# Patient Record
Sex: Female | Born: 1975
Health system: Southern US, Community
[De-identification: ages and names within clinical notes are randomized; demographics above are authoritative.]

## PROBLEM LIST (undated history)

## (undated) DIAGNOSIS — K219 Gastro-esophageal reflux disease without esophagitis: Secondary | ICD-10-CM

## (undated) HISTORY — DX: Gastro-esophageal reflux disease without esophagitis: K21.9

---

## 2000-09-30 ENCOUNTER — Other Ambulatory Visit: Admission: RE | Admit: 2000-09-30 | Discharge: 2000-09-30 | Payer: Self-pay | Admitting: Obstetrics and Gynecology

## 2000-10-04 ENCOUNTER — Encounter: Payer: Self-pay | Admitting: *Deleted

## 2000-10-04 ENCOUNTER — Emergency Department (HOSPITAL_COMMUNITY): Admission: EM | Admit: 2000-10-04 | Discharge: 2000-10-04 | Payer: Self-pay | Admitting: *Deleted

## 2001-10-22 ENCOUNTER — Other Ambulatory Visit: Admission: RE | Admit: 2001-10-22 | Discharge: 2001-10-22 | Payer: Self-pay | Admitting: Obstetrics & Gynecology

## 2001-11-18 ENCOUNTER — Ambulatory Visit (HOSPITAL_COMMUNITY): Admission: RE | Admit: 2001-11-18 | Discharge: 2001-11-18 | Payer: Self-pay | Admitting: Obstetrics & Gynecology

## 2001-11-18 ENCOUNTER — Encounter: Payer: Self-pay | Admitting: Obstetrics & Gynecology

## 2003-07-01 ENCOUNTER — Other Ambulatory Visit: Admission: RE | Admit: 2003-07-01 | Discharge: 2003-07-01 | Payer: Self-pay | Admitting: Family Medicine

## 2003-09-23 ENCOUNTER — Ambulatory Visit (HOSPITAL_COMMUNITY): Admission: RE | Admit: 2003-09-23 | Discharge: 2003-09-23 | Payer: Self-pay | Admitting: Family Medicine

## 2003-12-10 ENCOUNTER — Ambulatory Visit (HOSPITAL_COMMUNITY): Admission: RE | Admit: 2003-12-10 | Discharge: 2003-12-10 | Payer: Self-pay | Admitting: Family Medicine

## 2004-05-12 ENCOUNTER — Ambulatory Visit: Payer: Self-pay | Admitting: *Deleted

## 2004-05-12 ENCOUNTER — Inpatient Hospital Stay (HOSPITAL_COMMUNITY): Admission: AD | Admit: 2004-05-12 | Discharge: 2004-05-17 | Payer: Self-pay | Admitting: Family Medicine

## 2004-06-26 ENCOUNTER — Other Ambulatory Visit: Admission: RE | Admit: 2004-06-26 | Discharge: 2004-06-26 | Payer: Self-pay | Admitting: Family Medicine

## 2012-07-03 ENCOUNTER — Telehealth: Payer: Self-pay | Admitting: Nurse Practitioner

## 2012-07-03 MED ORDER — OMEPRAZOLE 20 MG PO CPDR: 20.0000 mg | DELAYED_RELEASE_CAPSULE | Freq: Every day | ORAL | Status: DC

## 2012-07-03 NOTE — Telephone Encounter (Signed)
Refilled at [facility] by [facility]

## 2012-08-08 ENCOUNTER — Ambulatory Visit (INDEPENDENT_AMBULATORY_CARE_PROVIDER_SITE_OTHER): Payer: BC Managed Care – PPO

## 2012-08-08 ENCOUNTER — Ambulatory Visit (INDEPENDENT_AMBULATORY_CARE_PROVIDER_SITE_OTHER): Payer: BC Managed Care – PPO | Admitting: General Practice

## 2012-08-08 ENCOUNTER — Encounter: Payer: Self-pay | Admitting: General Practice

## 2012-08-08 VITALS — BP 111/78 | HR 91 | Temp 97.1°F | Ht 63.0 in | Wt 142.0 lb

## 2012-08-08 DIAGNOSIS — R109 Unspecified abdominal pain: Secondary | ICD-10-CM

## 2012-08-08 LAB — POCT CBC
Granulocyte percent: 90.9 %G — AB (ref 37–80)
HCT, POC: 38 % (ref 37.7–47.9)
Hemoglobin: 13.3 g/dL (ref 12.2–16.2)
Lymph, poc: 0.9 (ref 0.6–3.4)
MCH, POC: 30.4 pg (ref 27–31.2)
MCHC: 35 g/dL (ref 31.8–35.4)
MCV: 86.9 fL (ref 80–97)
MPV: 7.9 fL (ref 0–99.8)
POC Granulocyte: 11.2 — AB (ref 2–6.9)
POC LYMPH PERCENT: 7.4 %L — AB (ref 10–50)
Platelet Count, POC: 252 10*3/uL (ref 142–424)
RBC: 4.4 M/uL (ref 4.04–5.48)
RDW, POC: 13.4 %
WBC: 12.3 10*3/uL — AB (ref 4.6–10.2)

## 2012-08-08 LAB — POCT URINE PREGNANCY: Preg Test, Ur: NEGATIVE

## 2012-08-08 NOTE — Patient Instructions (Addendum)

## 2012-08-08 NOTE — Progress Notes (Signed)
  Subjective:    Patient ID: Kathryn Singh, female    DOB: 1975/05/22, 37 y.o.   MRN: [id number]  Abdominal Pain This is a new problem. The current episode started 1 to 4 weeks ago. The onset quality is gradual. The problem occurs constantly. The problem has been gradually worsening. The pain is located in the epigastric region. The pain is at a severity of 9/10. The quality of the pain is aching. The abdominal pain radiates to the epigastric region and back. Associated symptoms include nausea. Pertinent negatives include no belching, constipation, fever, headaches or hematuria. Nothing aggravates the pain. The pain is relieved by nothing. Treatments tried: ibuprofen. Her past medical history is significant for abdominal surgery. c-section  Reports menstrual cycle began last Aug 01, 2012. Reports over past three months menstrual cramps have worsened. Reports menstrual cycle ended on Tuesday. Reports pain is worse today. Reports taking omeprazole as directed.     Review of Systems  Constitutional: Negative for fever and chills.  Respiratory: Negative for chest tightness and shortness of breath.   Cardiovascular: Negative for chest pain and palpitations.  Gastrointestinal: Positive for nausea and abdominal pain. Negative for constipation, blood in stool and anal bleeding.  Genitourinary: Negative for hematuria and difficulty urinating.  Musculoskeletal: Positive for back pain.  Skin: Negative.   Neurological: Negative for dizziness and headaches.  Psychiatric/Behavioral: Negative.        Objective:   Physical Exam  Constitutional: She is oriented to person, place, and time. She appears well-developed and well-nourished.  Cardiovascular: Normal rate, regular rhythm and normal heart sounds.   Pulmonary/Chest: Effort normal and breath sounds normal. No respiratory distress. She exhibits no tenderness.  Abdominal: Soft. There is no CVA tenderness, no tenderness at McBurney's point and negative  Murphy's sign.  Tenderness with light palpation  Neurological: She is alert and oriented to person, place, and time.  Skin: Skin is warm and dry.  Psychiatric: She has a normal mood and affect.   Results for orders placed in visit on 08/08/12  POCT CBC      Result Value Range   WBC 12.3 (*) 4.6 - 10.2 K/uL   Lymph, poc 0.9  0.6 - 3.4   POC LYMPH PERCENT 7.4 (*) 10 - 50 %L   POC Granulocyte 11.2 (*) 2 - 6.9   Granulocyte percent 90.9 (*) 37 - 80 %G   RBC 4.4  4.04 - 5.48 M/uL   Hemoglobin 13.3  12.2 - 16.2 g/dL   HCT, POC 40.9  81.1 - 47.9 %   MCV 86.9  80 - 97 fL   MCH, POC 30.4  27 - 31.2 pg   MCHC 35.0  31.8 - 35.4 g/dL   RDW, POC 91.4     Platelet Count, POC 252.0  142 - 424 K/uL   MPV 7.9  0 - 99.8 fL  POCT URINE PREGNANCY      Result Value Range   Preg Test, Ur Negative    [facility] reading by [facility], FNP-C, no large amount of air or stool noted.                                      Assessment & Plan:  1. Abdominal pain, unspecified site - POCT CBC - KUB -Advised patient to go to local emergency room (pt's husband driving her to [facility])  Coralie Keens, FNP-C

## 2012-08-18 ENCOUNTER — Encounter: Payer: Self-pay | Admitting: *Deleted

## 2012-08-18 NOTE — Progress Notes (Signed)
Letter mailed

## 2012-09-04 ENCOUNTER — Other Ambulatory Visit: Payer: Self-pay | Admitting: General Practice

## 2013-03-02 ENCOUNTER — Other Ambulatory Visit: Payer: Self-pay

## 2013-03-02 NOTE — Telephone Encounter (Signed)
Last seen 08/08/12  mae

## 2013-03-04 ENCOUNTER — Other Ambulatory Visit: Payer: Self-pay

## 2013-03-04 NOTE — Telephone Encounter (Signed)
latr seen 08/08/12  Mae

## 2013-03-06 ENCOUNTER — Other Ambulatory Visit: Payer: Self-pay | Admitting: General Practice

## 2013-03-06 MED ORDER — OMEPRAZOLE 20 MG PO CPDR: 20.0000 mg | DELAYED_RELEASE_CAPSULE | Freq: Every day | ORAL | Status: DC

## 2013-10-01 ENCOUNTER — Other Ambulatory Visit: Payer: Self-pay | Admitting: Nurse Practitioner

## 2013-10-16 ENCOUNTER — Other Ambulatory Visit: Payer: Self-pay | Admitting: Nurse Practitioner

## 2013-11-03 ENCOUNTER — Ambulatory Visit (INDEPENDENT_AMBULATORY_CARE_PROVIDER_SITE_OTHER): Payer: BC Managed Care – PPO | Admitting: Family

## 2013-11-03 ENCOUNTER — Encounter: Payer: Self-pay | Admitting: Family

## 2013-11-03 VITALS — BP 122/80 | HR 69 | Temp 98.1°F | Ht 63.0 in | Wt 141.8 lb

## 2013-11-03 DIAGNOSIS — J302 Other seasonal allergic rhinitis: Secondary | ICD-10-CM

## 2013-11-03 DIAGNOSIS — Z0000 Encounter for general adult medical examination without abnormal findings: Secondary | ICD-10-CM

## 2013-11-03 DIAGNOSIS — K219 Gastro-esophageal reflux disease without esophagitis: Secondary | ICD-10-CM

## 2013-11-03 DIAGNOSIS — J309 Allergic rhinitis, unspecified: Secondary | ICD-10-CM

## 2013-11-03 MED ORDER — OMEPRAZOLE 20 MG PO CPDR: 20.0000 mg | DELAYED_RELEASE_CAPSULE | Freq: Every day | ORAL | Status: DC

## 2013-11-03 MED ORDER — MONTELUKAST SODIUM 10 MG PO TABS: 10.0000 mg | ORAL_TABLET | Freq: Every day | ORAL | Status: DC

## 2013-11-03 NOTE — Patient Instructions (Signed)

## 2013-11-03 NOTE — Progress Notes (Signed)
   Subjective:    Patient ID: Kathryn Singh, female    DOB: 30-Jul-1975, 38 y.o.   MRN: [id number]  Pt presents to the office for annual physical. Gastrophageal Reflux She reports no chest pain, no coughing, no heartburn, no sore throat or no wheezing. This is a chronic problem. The current episode started more than 1 year ago. The problem occurs rarely. The problem has been resolved. The symptoms are aggravated by certain foods. Pertinent negatives include no fatigue. She has tried a PPI for the symptoms. The treatment provided significant relief.      Review of Systems  Constitutional: Negative.  Negative for fatigue.  HENT: Positive for sneezing. Negative for sore throat.   Eyes: Negative.   Respiratory: Negative.  Negative for cough, shortness of breath and wheezing.   Cardiovascular: Negative.  Negative for chest pain and palpitations.  Gastrointestinal: Negative.  Negative for heartburn.  Endocrine: Negative.   Genitourinary: Negative.   Musculoskeletal: Negative.   Neurological: Negative.  Negative for headaches.  Hematological: Negative.   Psychiatric/Behavioral: Negative.   All other systems reviewed and are negative.      Objective:   Physical Exam  Vitals reviewed. Constitutional: She is oriented to person, place, and time. She appears well-developed and well-nourished. No distress.  HENT:  Head: Normocephalic and atraumatic.  Right Ear: External ear normal.  Left Ear: External ear normal.  Nose: Nose normal.  Mouth/Throat: Oropharynx is clear and moist.  Eyes: Pupils are equal, round, and reactive to light.  Neck: Normal range of motion. Neck supple. No thyromegaly present.  Cardiovascular: Normal rate, regular rhythm, normal heart sounds and intact distal pulses.   No murmur heard. Pulmonary/Chest: Effort normal and breath sounds normal. No respiratory distress. She has no wheezes.  Abdominal: Soft. Bowel sounds are normal. She exhibits no distension. There is no  tenderness.  Musculoskeletal: Normal range of motion. She exhibits no edema and no tenderness.  Neurological: She is alert and oriented to person, place, and time. She has normal reflexes. No cranial nerve deficit.  Skin: Skin is warm and dry.  Psychiatric: She has a normal mood and affect. Her behavior is normal. Judgment and thought content normal.    BP 122/80  Pulse 69  Temp(Src) 98.1 F (36.7 C) (Oral)  Ht _0  (1.6 m)  Wt 141 lb 12.8 oz (64.32 kg)  BMI 25.13 kg/m2       Assessment & Plan:  1. Gastroesophageal reflux disease without esophagitis - omeprazole (PRILOSEC) 20 MG capsule; Take 1 capsule (20 mg total) by mouth daily.  Dispense: 90 capsule; Refill: 4 - CMP14+EGFR  2. Seasonal allergies - CMP14+EGFR - montelukast (SINGULAIR) 10 MG tablet; Take 1 tablet (10 mg total) by mouth at bedtime.  Dispense: 90 tablet; Refill: 4  3. Annual physical exam - CMP14+EGFR - Lipid panel - Vit D  25 hydroxy (rtn osteoporosis monitoring)   Continue all meds Labs pending Health Maintenance reviewed Diet and exercise encouraged RTO 1 year  Evelina Dun, FNP

## 2013-11-04 ENCOUNTER — Other Ambulatory Visit: Payer: Self-pay | Admitting: Family

## 2013-11-04 LAB — LIPID PANEL
CHOL/HDL RATIO: 4.5 ratio — AB (ref 0.0–4.4)
Cholesterol, Total: 195 mg/dL (ref 100–199)
HDL: 43 mg/dL (ref >39)
LDL Calculated: 119 mg/dL — ABNORMAL HIGH (ref 0–99)
TRIGLYCERIDES: 166 mg/dL — AB (ref 0–149)
VLDL CHOLESTEROL CAL: 33 mg/dL (ref 5–40)

## 2013-11-04 LAB — CMP14+EGFR
ALK PHOS: 76 IU/L (ref 39–117)
ALT: 27 IU/L (ref 0–32)
AST: 21 IU/L (ref 0–40)
Albumin/Globulin Ratio: 1.4 (ref 1.1–2.5)
Albumin: 4.3 g/dL (ref 3.5–5.5)
BUN / CREAT RATIO: 19 (ref 8–20)
BUN: 13 mg/dL (ref 6–20)
CALCIUM: 9.4 mg/dL (ref 8.7–10.2)
CO2: 22 mmol/L (ref 18–29)
Chloride: 101 mmol/L (ref 97–108)
Creatinine, Ser: 0.68 mg/dL (ref 0.57–1.00)
GFR calc Af Amer: 128 mL/min/{1.73_m2} (ref >59)
GFR calc non Af Amer: 111 mL/min/{1.73_m2} (ref >59)
GLOBULIN, TOTAL: 3 g/dL (ref 1.5–4.5)
Glucose: 88 mg/dL (ref 65–99)
Potassium: 4.2 mmol/L (ref 3.5–5.2)
Sodium: 138 mmol/L (ref 134–144)
Total Bilirubin: 0.5 mg/dL (ref 0.0–1.2)
Total Protein: 7.3 g/dL (ref 6.0–8.5)

## 2013-11-04 LAB — VITAMIN D 25 HYDROXY (VIT D DEFICIENCY, FRACTURES): Vit D, 25-Hydroxy: 25.2 ng/mL — ABNORMAL LOW (ref 30.0–100.0)

## 2013-11-04 MED ORDER — SIMVASTATIN 20 MG PO TABS: 20.0000 mg | ORAL_TABLET | Freq: Every day | ORAL | Status: DC

## 2013-11-04 MED ORDER — VITAMIN D (ERGOCALCIFEROL) 1.25 MG (50000 UNIT) PO CAPS: 50000.0000 [IU] | ORAL_CAPSULE | ORAL | Status: DC

## 2013-11-05 ENCOUNTER — Encounter: Payer: Self-pay | Admitting: Family Medicine

## 2013-11-30 ENCOUNTER — Ambulatory Visit (INDEPENDENT_AMBULATORY_CARE_PROVIDER_SITE_OTHER): Payer: BC Managed Care – PPO | Admitting: Family Medicine

## 2013-11-30 VITALS — BP 117/75 | HR 115 | Temp 100.5°F | Ht 63.0 in | Wt 144.0 lb

## 2013-11-30 DIAGNOSIS — J029 Acute pharyngitis, unspecified: Secondary | ICD-10-CM

## 2013-11-30 DIAGNOSIS — D72829 Elevated white blood cell count, unspecified: Secondary | ICD-10-CM

## 2013-11-30 DIAGNOSIS — R197 Diarrhea, unspecified: Secondary | ICD-10-CM

## 2013-11-30 DIAGNOSIS — R52 Pain, unspecified: Secondary | ICD-10-CM

## 2013-11-30 DIAGNOSIS — R109 Unspecified abdominal pain: Secondary | ICD-10-CM

## 2013-11-30 LAB — POCT URINALYSIS DIPSTICK
Bilirubin, UA: NEGATIVE
Glucose, UA: NEGATIVE
Ketones, UA: NEGATIVE
Leukocytes, UA: NEGATIVE
Nitrite, UA: NEGATIVE
Spec Grav, UA: 1.01
Urobilinogen, UA: NEGATIVE
pH, UA: 8

## 2013-11-30 LAB — POCT UA - MICROSCOPIC ONLY
Casts, Ur, LPF, POC: NEGATIVE
Crystals, Ur, HPF, POC: NEGATIVE
Yeast, UA: NEGATIVE

## 2013-11-30 LAB — POCT CBC
Granulocyte percent: 90.1 %G — AB (ref 37–80)
HCT, POC: 36.6 % — AB (ref 37.7–47.9)
Hemoglobin: 11.8 g/dL — AB (ref 12.2–16.2)
Lymph, poc: 0.9 (ref 0.6–3.4)
MCH, POC: 27 pg (ref 27–31.2)
MCHC: 32.2 g/dL (ref 31.8–35.4)
MCV: 83.6 fL (ref 80–97)
MPV: 7.7 fL (ref 0–99.8)
POC Granulocyte: 13.3 — AB (ref 2–6.9)
POC LYMPH PERCENT: 5.9 %L — AB (ref 10–50)
Platelet Count, POC: 273 10*3/uL (ref 142–424)
RBC: 4.4 M/uL (ref 4.04–5.48)
RDW, POC: 14.4 %
WBC: 14.8 10*3/uL — AB (ref 4.6–10.2)

## 2013-11-30 LAB — POCT RAPID STREP A (OFFICE): Rapid Strep A Screen: NEGATIVE

## 2013-11-30 LAB — POCT INFLUENZA A/B
Influenza A, POC: NEGATIVE
Influenza B, POC: NEGATIVE

## 2013-11-30 MED ORDER — CEFTRIAXONE SODIUM 1 G IJ SOLR
1.0000 g | INTRAMUSCULAR | Status: AC
  Administered 2013-11-30: 1 g via INTRAMUSCULAR

## 2013-11-30 MED ORDER — HYDROCODONE-ACETAMINOPHEN 5-325 MG PO TABS: 1.0000 | ORAL_TABLET | Freq: Four times a day (QID) | ORAL | Status: DC | PRN

## 2013-11-30 MED ORDER — AZITHROMYCIN 250 MG PO TABS: ORAL_TABLET | ORAL | Status: DC

## 2013-11-30 NOTE — Progress Notes (Signed)
Subjective:    Patient ID: Kathryn Singh, female    DOB: [birth year], 39 y.o.   MRN: [id number]  HPI This 38 y.o. female presents for evaluation of c/o sore throat, diarrhea,back pain and malaise. She has been feeling like this for 2 days.  She has been having low grade fever.  She c/o nausea and  Lower abdominal discomfort.  She c/o severe sore throat and back pain.   She denies any vaginal DC Or productive cough.  She denies urinary sx's.   Review of Systems C/o sore throat, abdominal pain, myalgias and arthralgias, fever, malaise No chest pain, SOB, HA, dizziness, vision change, N/V, diarrhea, constipation, dysuria, urinary urgency or frequency or rash.     Objective:   Physical Exam  Vital signs noted  Acutely ill appearing female  HEENT - Head atraumatic Normocephalic                Eyes - PERRLA, Conjuctiva - clear Sclera- Clear EOMI                Ears - EAC's Wnl TM's Wnl Gross Hearing WNL                Throat - oropharanx injected                Neck - supple no bruits, chin to chest w/o neck pain. Respiratory - Lungs CTA bilateral Cardiac - RRR S1 and S2 without murmur GI - Abdomen soft tender in lower abdomen bilateral and bowel sounds active x 4 Extremities - No edema. Neuro - Grossly intact.  Results for orders placed in visit on 11/30/13  POCT INFLUENZA A/B      Result Value Ref Range   Influenza A, POC Negative     Influenza B, POC Negative    POCT RAPID STREP A (OFFICE)      Result Value Ref Range   Rapid Strep A Screen Negative  Negative  POCT CBC      Result Value Ref Range   WBC 14.8 (*) 4.6 - 10.2 K/uL   Lymph, poc 0.9  0.6 - 3.4   POC LYMPH PERCENT 5.9 (*) 10 - 50 %L   MID (cbc)    0 - 0.9   POC MID %    0 - 12 %M   POC Granulocyte 13.3 (*) 2 - 6.9   Granulocyte percent 90.1 (*) 37 - 80 %G   RBC 4.4  4.04 - 5.48 M/uL   Hemoglobin 11.8 (*) 12.2 - 16.2 g/dL   HCT, POC 40.9 (*) 81.1 - 47.9 %   MCV 83.6  80 - 97 fL   MCH, POC 27.0  27 - 31.2 pg    MCHC 32.2  31.8 - 35.4 g/dL   RDW, POC 91.4     Platelet Count, POC 273.0  142 - 424 K/uL   MPV 7.7  0 - 99.8 fL  POCT UA - MICROSCOPIC ONLY      Result Value Ref Range   WBC, Ur, HPF, POC 5-10     RBC, urine, microscopic 1-5     Bacteria, U Microscopic FEW     Mucus, UA MOD     Epithelial cells, urine per micros FEW     Crystals, Ur, HPF, POC NEG     Casts, Ur, LPF, POC NEG     Yeast, UA NEG    POCT URINALYSIS DIPSTICK      Result Value Ref Range  Color, UA GOLD     Clarity, UA CLEAR     Glucose, UA NEG     Bilirubin, UA NEG     Ketones, UA NEG     Spec Grav, UA 1.010     Blood, UA TRACE     pH, UA 8.0     Protein, UA TRACE     Urobilinogen, UA negative     Nitrite, UA NEG     Leukocytes, UA Negative        Assessment & Plan:  Diarrhea - Plan: POCT Influenza A/B, POCT rapid strep A  Sore throat - Plan: POCT Influenza A/B, POCT rapid strep A, cefTRIAXone (ROCEPHIN) injection 1 g, azithromycin (ZITHROMAX) 250 MG tablet  Body aches - Plan: POCT Influenza A/B, POCT rapid strep A, POCT CBC, POCT UA - Microscopic Only, POCT urinalysis dipstick, HYDROcodone-acetaminophen (NORCO) 5-325 MG per tablet  Abdominal pain, unspecified site - Plan: POCT CBC, POCT UA - Microscopic Only, POCT urinalysis dipstick  Leukocytosis, unspecified - Plan: cefTRIAXone (ROCEPHIN) injection 1 g, azithromycin (ZITHROMAX) 250 MG tablet.  Could be due to pharyngitis.  Follow up if not feeling better in next few days.  [name] FNP

## 2013-12-01 LAB — URINE CULTURE: Organism ID, Bacteria: NO GROWTH

## 2013-12-02 ENCOUNTER — Telehealth: Payer: Self-pay | Admitting: Family Medicine

## 2013-12-02 ENCOUNTER — Other Ambulatory Visit: Payer: Self-pay | Admitting: Family Medicine

## 2013-12-02 MED ORDER — METHYLPREDNISOLONE (PAK) 4 MG PO TABS: ORAL_TABLET | ORAL | Status: DC

## 2013-12-02 MED ORDER — LEVOFLOXACIN 500 MG PO TABS: 500.0000 mg | ORAL_TABLET | Freq: Every day | ORAL | Status: DC

## 2013-12-02 NOTE — Telephone Encounter (Signed)
Spoke with patient.

## 2013-12-02 NOTE — Telephone Encounter (Signed)
Kathryn Stable do you know anything about this one?

## 2013-12-02 NOTE — Telephone Encounter (Signed)
meds sent to pharmacy

## 2013-12-24 ENCOUNTER — Other Ambulatory Visit: Payer: BC Managed Care – PPO | Admitting: Nurse Practitioner

## 2013-12-29 ENCOUNTER — Ambulatory Visit (INDEPENDENT_AMBULATORY_CARE_PROVIDER_SITE_OTHER): Payer: BC Managed Care – PPO

## 2013-12-29 DIAGNOSIS — Z23 Encounter for immunization: Secondary | ICD-10-CM

## 2014-01-01 ENCOUNTER — Other Ambulatory Visit: Payer: BC Managed Care – PPO | Admitting: Nurse Practitioner

## 2014-03-06 ENCOUNTER — Ambulatory Visit (INDEPENDENT_AMBULATORY_CARE_PROVIDER_SITE_OTHER): Payer: 59 | Admitting: Family Medicine

## 2014-03-06 VITALS — BP 99/64 | HR 77 | Temp 97.9°F | Ht 63.0 in | Wt 143.0 lb

## 2014-03-06 DIAGNOSIS — R300 Dysuria: Secondary | ICD-10-CM

## 2014-03-06 DIAGNOSIS — R319 Hematuria, unspecified: Secondary | ICD-10-CM

## 2014-03-06 DIAGNOSIS — N390 Urinary tract infection, site not specified: Secondary | ICD-10-CM

## 2014-03-06 LAB — POCT UA - MICROSCOPIC ONLY
Bacteria, U Microscopic: NEGATIVE
Casts, Ur, LPF, POC: NEGATIVE
Crystals, Ur, HPF, POC: NEGATIVE
Mucus, UA: NEGATIVE
RBC, urine, microscopic: NEGATIVE
Yeast, UA: NEGATIVE

## 2014-03-06 LAB — POCT URINALYSIS DIPSTICK
Bilirubin, UA: NEGATIVE
Blood, UA: NEGATIVE
Glucose, UA: NEGATIVE
Ketones, UA: NEGATIVE
Nitrite, UA: NEGATIVE
Protein, UA: NEGATIVE
Spec Grav, UA: 1.015
Urobilinogen, UA: NEGATIVE
pH, UA: 6.5

## 2014-03-06 MED ORDER — PHENAZOPYRIDINE HCL 200 MG PO TABS: 200.0000 mg | ORAL_TABLET | Freq: Three times a day (TID) | ORAL | Status: DC | PRN

## 2014-03-06 MED ORDER — CIPROFLOXACIN HCL 500 MG PO TABS: 500.0000 mg | ORAL_TABLET | Freq: Two times a day (BID) | ORAL | Status: DC

## 2014-03-06 NOTE — Progress Notes (Signed)
   Subjective:    Patient ID: Kathryn ForesterJuana M Hancock, female    DOB: 05/28/1975, 38 y.o.   MRN: [id number]  HPI Patient c/o urinary sx's  Review of Systems  Constitutional: Negative for fever.  HENT: Negative for ear pain.   Eyes: Negative for discharge.  Respiratory: Negative for cough.   Cardiovascular: Negative for chest pain.  Gastrointestinal: Negative for abdominal distention.  Endocrine: Negative for polyuria.  Genitourinary: Negative for difficulty urinating.  Musculoskeletal: Negative for gait problem and neck pain.  Skin: Negative for color change and rash.  Neurological: Negative for speech difficulty and headaches.  Psychiatric/Behavioral: Negative for agitation.       Objective:    BP 99/64 mmHg  Pulse 77  Temp(Src) 97.9 F (36.6 C) (Oral)  Ht 5\' 3"  (1.6 m)  Wt 143 lb (64.864 kg)  BMI 25.34 kg/m2 Physical Exam  Constitutional: She is oriented to person, place, and time. She appears well-developed and well-nourished.  HENT:  Head: Normocephalic and atraumatic.  Mouth/Throat: Oropharynx is clear and moist.  Eyes: Pupils are equal, round, and reactive to light.  Neck: Normal range of motion. Neck supple.  Cardiovascular: Normal rate and regular rhythm.   No murmur heard. Pulmonary/Chest: Effort normal and breath sounds normal.  Abdominal: Soft. Bowel sounds are normal. There is no tenderness.  Neurological: She is alert and oriented to person, place, and time.  Skin: Skin is warm and dry.  Psychiatric: She has a normal mood and affect.    Results for orders placed or performed in visit on 03/06/14  POCT UA - Microscopic Only  Result Value Ref Range   WBC, Ur, HPF, POC 10-15    RBC, urine, microscopic neg    Bacteria, U Microscopic neg    Mucus, UA neg    Epithelial cells, urine per micros occ    Crystals, Ur, HPF, POC neg    Casts, Ur, LPF, POC neg    Yeast, UA neg   POCT urinalysis dipstick  Result Value Ref Range   Color, UA yellow    Clarity, UA cloudy     Glucose, UA neg    Bilirubin, UA neg    Ketones, UA neg    Spec Grav, UA 1.015    Blood, UA neg    pH, UA 6.5    Protein, UA neg    Urobilinogen, UA negative    Nitrite, UA neg    Leukocytes, UA moderate (2+)         Assessment & Plan:     ICD-9-CM ICD-10-CM   1. Dysuria 788.1 R30.0 POCT UA - Microscopic Only     POCT urinalysis dipstick     ciprofloxacin (CIPRO) 500 MG tablet     phenazopyridine (PYRIDIUM) 200 MG tablet     Urine culture  2. Urinary tract infection with hematuria, site unspecified 599.0 N39.0 ciprofloxacin (CIPRO) 500 MG tablet    R31.9 phenazopyridine (PYRIDIUM) 200 MG tablet     Urine culture     No Follow-up on file.  Deatra CanterWilliam J Camreigh Michie FNP

## 2014-03-08 LAB — URINE CULTURE

## 2014-04-07 ENCOUNTER — Ambulatory Visit: Payer: 59 | Admitting: Family Medicine

## 2014-06-01 ENCOUNTER — Ambulatory Visit (INDEPENDENT_AMBULATORY_CARE_PROVIDER_SITE_OTHER): Payer: 59 | Admitting: Family

## 2014-06-01 ENCOUNTER — Encounter: Payer: Self-pay | Admitting: Family

## 2014-06-01 VITALS — BP 101/71 | HR 90 | Temp 97.9°F | Ht 63.0 in | Wt 142.6 lb

## 2014-06-01 DIAGNOSIS — K219 Gastro-esophageal reflux disease without esophagitis: Secondary | ICD-10-CM

## 2014-06-01 DIAGNOSIS — F32A Depression, unspecified: Secondary | ICD-10-CM

## 2014-06-01 DIAGNOSIS — J309 Allergic rhinitis, unspecified: Secondary | ICD-10-CM | POA: Insufficient documentation

## 2014-06-01 DIAGNOSIS — Z01419 Encounter for gynecological examination (general) (routine) without abnormal findings: Secondary | ICD-10-CM

## 2014-06-01 DIAGNOSIS — F329 Major depressive disorder, single episode, unspecified: Secondary | ICD-10-CM

## 2014-06-01 DIAGNOSIS — Z0000 Encounter for general adult medical examination without abnormal findings: Secondary | ICD-10-CM

## 2014-06-01 DIAGNOSIS — E785 Hyperlipidemia, unspecified: Secondary | ICD-10-CM

## 2014-06-01 DIAGNOSIS — J302 Other seasonal allergic rhinitis: Secondary | ICD-10-CM

## 2014-06-01 MED ORDER — MONTELUKAST SODIUM 10 MG PO TABS: 10.0000 mg | ORAL_TABLET | Freq: Every day | ORAL | Status: DC

## 2014-06-01 MED ORDER — SIMVASTATIN 20 MG PO TABS: 20.0000 mg | ORAL_TABLET | Freq: Every day | ORAL | Status: DC

## 2014-06-01 MED ORDER — ESCITALOPRAM OXALATE 5 MG PO TABS: 5.0000 mg | ORAL_TABLET | Freq: Every day | ORAL | Status: DC

## 2014-06-01 MED ORDER — OMEPRAZOLE 20 MG PO CPDR: 20.0000 mg | DELAYED_RELEASE_CAPSULE | Freq: Every day | ORAL | Status: DC

## 2014-06-01 NOTE — Progress Notes (Signed)
Subjective:    Patient ID: ENGLISH TOMER, female    DOB: 1975/04/14, 39 y.o.   MRN: [id number]  Pt presents to the office today for CPE with pap. Pt has the following chronic conditions.  Gynecologic Exam Pertinent negatives include no headaches, nausea or sore throat.  Hyperlipidemia This is a chronic problem. The current episode started more than 1 year ago. The problem is uncontrolled. Recent lipid tests were reviewed and are high. She has no history of diabetes or hypothyroidism. Pertinent negatives include no chest pain, leg pain, myalgias or shortness of breath. Current antihyperlipidemic treatment includes statins. The current treatment provides moderate improvement of lipids. Risk factors for coronary artery disease include dyslipidemia, family history and a sedentary lifestyle.  Gastrophageal Reflux She reports no chest pain, no coughing, no heartburn, no nausea, no sore throat or no wheezing. This is a chronic problem. The current episode started more than 1 year ago. The problem occurs rarely. The problem has been resolved. The symptoms are aggravated by certain foods. She has tried a PPI for the symptoms. The treatment provided significant relief.      Review of Systems  Constitutional: Negative.   HENT: Negative.  Negative for sore throat.   Eyes: Negative.   Respiratory: Negative.  Negative for cough, shortness of breath and wheezing.   Cardiovascular: Negative.  Negative for chest pain and palpitations.  Gastrointestinal: Negative.  Negative for heartburn and nausea.  Endocrine: Positive for heat intolerance.  Genitourinary: Negative.   Musculoskeletal: Negative.  Negative for myalgias.  Neurological: Negative.  Negative for headaches.  Hematological: Negative.   Psychiatric/Behavioral: Positive for decreased concentration.  All other systems reviewed and are negative.      Objective:   Physical Exam  Constitutional: She is oriented to person, place, and time. She  appears well-developed and well-nourished. No distress.  HENT:  Head: Normocephalic and atraumatic.  Right Ear: External ear normal.  Left Ear: External ear normal.  Nose: Nose normal.  Mouth/Throat: Oropharynx is clear and moist.  Eyes: Pupils are equal, round, and reactive to light.  Neck: Normal range of motion. Neck supple. No thyromegaly present.  Cardiovascular: Normal rate, regular rhythm, normal heart sounds and intact distal pulses.   No murmur heard. Pulmonary/Chest: Effort normal and breath sounds normal. No respiratory distress. She has no wheezes. Right breast exhibits no inverted nipple, no mass, no nipple discharge, no skin change and no tenderness. Left breast exhibits no inverted nipple, no mass, no nipple discharge, no skin change and no tenderness. Breasts are symmetrical.  Abdominal: Soft. Bowel sounds are normal. She exhibits no distension. There is no tenderness.  Genitourinary:  Bimanual exam- no adnexal masses or tenderness, ovaries nonpalpable   Cervix parous and pink- No discharge   Musculoskeletal: Normal range of motion. She exhibits no edema or tenderness.  Neurological: She is alert and oriented to person, place, and time. She has normal reflexes. No cranial nerve deficit.  Skin: Skin is warm and dry.  Psychiatric: She has a normal mood and affect. Her behavior is normal. Judgment and thought content normal.  Vitals reviewed.   BP 101/71 mmHg  Pulse 90  Temp(Src) 97.9 F (36.6 C) (Oral)  Ht '5\' 3"'  (1.6 m)  Wt 142 lb 9.6 oz (64.683 kg)  BMI 25.27 kg/m2  LMP 05/11/2014       Assessment & Plan:  1. Hyperlipidemia - simvastatin (ZOCOR) 20 MG tablet; Take 1 tablet (20 mg total) by mouth at bedtime.  Dispense: 90  tablet; Refill: 4  2. Allergic rhinitis, unspecified allergic rhinitis type - montelukast (SINGULAIR) 10 MG tablet; Take 1 tablet (10 mg total) by mouth at bedtime.  Dispense: 90 tablet; Refill: 4  3. Gastroesophageal reflux disease without  esophagitis - omeprazole (PRILOSEC) 20 MG capsule; Take 1 capsule (20 mg total) by mouth daily.  Dispense: 90 capsule; Refill: 4  4. Seasonal allergies  5. Annual physical exam - CMP14+EGFR; Future - Lipid panel; Future - Vit D  25 hydroxy (rtn osteoporosis monitoring); Future - Thyroid Panel With TSH; Future - Pap IG w/ reflex to HPV when ASC-U  6. Encounter for routine gynecological examination - Pap IG w/ reflex to HPV when ASC-U   Continue all meds Labs pending Health Maintenance reviewed Diet and exercise encouraged RTO 1 year  Evelina Dun, FNP

## 2014-06-01 NOTE — Patient Instructions (Signed)

## 2014-06-02 ENCOUNTER — Telehealth: Payer: Self-pay | Admitting: Family

## 2014-06-02 NOTE — Telephone Encounter (Signed)
Detailed message left that letter has been faxed.  

## 2014-06-03 ENCOUNTER — Other Ambulatory Visit (INDEPENDENT_AMBULATORY_CARE_PROVIDER_SITE_OTHER): Payer: 59

## 2014-06-03 DIAGNOSIS — Z0000 Encounter for general adult medical examination without abnormal findings: Secondary | ICD-10-CM

## 2014-06-03 NOTE — Progress Notes (Signed)
Lab only 

## 2014-06-04 LAB — CMP14+EGFR
ALT: 20 IU/L (ref 0–32)
AST: 14 IU/L (ref 0–40)
Albumin/Globulin Ratio: 1.4 (ref 1.1–2.5)
Albumin: 4.2 g/dL (ref 3.5–5.5)
Alkaline Phosphatase: 73 IU/L (ref 39–117)
BUN/Creatinine Ratio: 19 (ref 8–20)
BUN: 12 mg/dL (ref 6–20)
Bilirubin Total: 0.5 mg/dL (ref 0.0–1.2)
CO2: 21 mmol/L (ref 18–29)
Calcium: 9.1 mg/dL (ref 8.7–10.2)
Chloride: 104 mmol/L (ref 97–108)
Creatinine, Ser: 0.64 mg/dL (ref 0.57–1.00)
GFR calc non Af Amer: 113 mL/min/{1.73_m2} (ref >59)
GFR, EST AFRICAN AMERICAN: 130 mL/min/{1.73_m2} (ref >59)
GLUCOSE: 89 mg/dL (ref 65–99)
Globulin, Total: 2.9 g/dL (ref 1.5–4.5)
Potassium: 4.3 mmol/L (ref 3.5–5.2)
Sodium: 139 mmol/L (ref 134–144)
TOTAL PROTEIN: 7.1 g/dL (ref 6.0–8.5)

## 2014-06-04 LAB — LIPID PANEL
CHOL/HDL RATIO: 4.7 ratio — AB (ref 0.0–4.4)
CHOLESTEROL TOTAL: 191 mg/dL (ref 100–199)
HDL: 41 mg/dL (ref >39)
LDL CALC: 124 mg/dL — AB (ref 0–99)
Triglycerides: 129 mg/dL (ref 0–149)
VLDL CHOLESTEROL CAL: 26 mg/dL (ref 5–40)

## 2014-06-04 LAB — PAP IG W/ RFLX HPV ASCU: PAP Smear Comment: 0

## 2014-06-04 LAB — THYROID PANEL WITH TSH
Free Thyroxine Index: 2.2 (ref 1.2–4.9)
T3 UPTAKE RATIO: 25 % (ref 24–39)
T4 TOTAL: 8.9 ug/dL (ref 4.5–12.0)
TSH: 1.44 u[IU]/mL (ref 0.450–4.500)

## 2014-06-04 LAB — VITAMIN D 25 HYDROXY (VIT D DEFICIENCY, FRACTURES): Vit D, 25-Hydroxy: 12.7 ng/mL — ABNORMAL LOW (ref 30.0–100.0)

## 2014-06-07 ENCOUNTER — Telehealth: Payer: Self-pay | Admitting: Family

## 2014-06-07 ENCOUNTER — Other Ambulatory Visit: Payer: Self-pay | Admitting: Family

## 2014-06-07 DIAGNOSIS — E785 Hyperlipidemia, unspecified: Secondary | ICD-10-CM

## 2014-06-07 DIAGNOSIS — E559 Vitamin D deficiency, unspecified: Secondary | ICD-10-CM | POA: Insufficient documentation

## 2014-06-07 MED ORDER — VITAMIN D (ERGOCALCIFEROL) 1.25 MG (50000 UNIT) PO CAPS: 50000.0000 [IU] | ORAL_CAPSULE | ORAL | Status: DC

## 2014-06-07 MED ORDER — SIMVASTATIN 40 MG PO TABS: 40.0000 mg | ORAL_TABLET | Freq: Every day | ORAL | Status: DC

## 2014-06-10 ENCOUNTER — Telehealth: Payer: Self-pay | Admitting: Family

## 2014-06-10 NOTE — Telephone Encounter (Signed)
She does not feel like escitalopram 5mg  is not helping and wants to know if you will increase the mg?

## 2014-06-13 MED ORDER — ESCITALOPRAM OXALATE 10 MG PO TABS
10.0000 mg | ORAL_TABLET | Freq: Every day | ORAL | Status: DC
Start: 2014-06-13 — End: 2014-09-21

## 2014-06-13 NOTE — Telephone Encounter (Signed)
Dose of lexapro incrased to 10 mg daily

## 2014-06-14 ENCOUNTER — Telehealth: Payer: Self-pay | Admitting: Family Medicine

## 2014-06-14 NOTE — Telephone Encounter (Signed)
appt scheduled

## 2014-06-15 ENCOUNTER — Ambulatory Visit (INDEPENDENT_AMBULATORY_CARE_PROVIDER_SITE_OTHER): Payer: 59 | Admitting: Family

## 2014-06-15 ENCOUNTER — Encounter: Payer: Self-pay | Admitting: Family

## 2014-06-15 VITALS — BP 112/78 | HR 77 | Temp 98.1°F | Ht 63.0 in | Wt 135.0 lb

## 2014-06-15 DIAGNOSIS — J069 Acute upper respiratory infection, unspecified: Secondary | ICD-10-CM

## 2014-06-15 DIAGNOSIS — J309 Allergic rhinitis, unspecified: Secondary | ICD-10-CM | POA: Diagnosis not present

## 2014-06-15 DIAGNOSIS — J029 Acute pharyngitis, unspecified: Secondary | ICD-10-CM

## 2014-06-15 MED ORDER — AZITHROMYCIN 250 MG PO TABS: ORAL_TABLET | ORAL | Status: DC

## 2014-06-15 NOTE — Patient Instructions (Addendum)
Pharyngitis Pharyngitis is redness, pain, and swelling (inflammation) of your pharynx.  CAUSES  Pharyngitis is usually caused by infection. Most of the time, these infections are from viruses (viral) and are part of a cold. However, sometimes pharyngitis is caused by bacteria (bacterial). Pharyngitis can also be caused by allergies. Viral pharyngitis may be spread from person to person by coughing, sneezing, and personal items or utensils (cups, forks, spoons, toothbrushes). Bacterial pharyngitis may be spread from person to person by more intimate contact, such as kissing.  SIGNS AND SYMPTOMS  Symptoms of pharyngitis include:   Sore throat.   Tiredness (fatigue).   Low-grade fever.   Headache.  Joint pain and muscle aches.  Skin rashes.  Swollen lymph nodes.  Plaque-like film on throat or tonsils (often seen with bacterial pharyngitis). DIAGNOSIS  Your health care provider will ask you questions about your illness and your symptoms. Your medical history, along with a physical exam, is often all that is needed to diagnose pharyngitis. Sometimes, a rapid strep test is done. Other lab tests may also be done, depending on the suspected cause.  TREATMENT  Viral pharyngitis will usually get better in 3-4 days without the use of medicine. Bacterial pharyngitis is treated with medicines that kill germs (antibiotics).  HOME CARE INSTRUCTIONS   Drink enough water and fluids to keep your urine clear or pale yellow.   Only take over-the-counter or prescription medicines as directed by your health care provider:   If you are prescribed antibiotics, make sure you finish them even if you start to feel better.   Do not take aspirin.   Get lots of rest.   Gargle with 8 oz of salt water ( tsp of salt per 1 qt of water) as often as every 1-2 hours to soothe your throat.   Throat lozenges (if you are not at risk for choking) or sprays may be used to soothe your throat. SEEK MEDICAL  CARE IF:   You have large, tender lumps in your neck.  You have a rash.  You cough up green, yellow-brown, or bloody spit. SEEK IMMEDIATE MEDICAL CARE IF:   Your neck becomes stiff.  You drool or are unable to swallow liquids.  You vomit or are unable to keep medicines or liquids down.  You have severe pain that does not go away with the use of recommended medicines.  You have trouble breathing (not caused by a stuffy nose). MAKE SURE YOU:   Understand these instructions.  Will watch your condition.  Will get help right away if you are not doing well or get worse. Document Released: 03/05/2005 Document Revised: 12/24/2012 Document Reviewed: 11/10/2012 [employer] Patient Information 2015 [employer]. This information is not intended to replace advice given to you by your health care provider. Make sure you discuss any questions you have with your health care provider.  - Take meds as prescribed - Use a cool mist humidifier  -Use saline nose sprays frequently -Saline irrigations of the nose can be very helpful if done frequently.  * 4X daily for 1 week*  * Use of a nettie pot can be helpful with this. Follow directions with this* -Force fluids -For any cough or congestion  Use plain Mucinex- regular strength or max strength is fine   * Children- consult with Pharmacist for dosing -For fever or aces or pains- take tylenol or ibuprofen appropriate for age and weight.  * for fevers greater than 101 orally you may alternate ibuprofen and tylenol every    3 hours. -Throat lozenges if help   Aswad Wandrey, FNP  

## 2014-06-15 NOTE — Progress Notes (Signed)
Subjective:    Patient ID: Kathryn Singh, female    DOB: 12/31/1975, 39 y.o.   MRN: [id number]  Sore Throat  This is a new problem. The current episode started in the past 7 days (Last Thursday). The problem has been unchanged. The pain is worse on the left side. The pain is at a severity of 7/10. The pain is mild. Associated symptoms include coughing, ear pain, headaches, a hoarse voice, a plugged ear sensation, swollen glands and trouble swallowing. Pertinent negatives include no congestion, diarrhea, ear discharge or shortness of breath. She has had no exposure to strep or mono. She has tried gargles and acetaminophen for the symptoms. The treatment provided mild relief.      Review of Systems  Constitutional: Negative.   HENT: Positive for ear pain, hoarse voice and trouble swallowing. Negative for congestion and ear discharge.   Eyes: Negative.   Respiratory: Positive for cough. Negative for shortness of breath.   Cardiovascular: Negative.  Negative for palpitations.  Gastrointestinal: Negative.  Negative for diarrhea.  Endocrine: Negative.   Genitourinary: Negative.   Musculoskeletal: Negative.   Neurological: Positive for headaches.  Hematological: Negative.   Psychiatric/Behavioral: Negative.   All other systems reviewed and are negative.      Objective:   Physical Exam  Constitutional: She is oriented to person, place, and time. She appears well-developed and well-nourished. No distress.  HENT:  Head: Normocephalic and atraumatic.  Right Ear: External ear normal.  Nasal passage erythemas with mild swelling  Oropharynx erythemas with tonsils mildly swollen and erythemas No exudate present   Eyes: Pupils are equal, round, and reactive to light. Left conjunctiva has a hemorrhage.  Neck: Normal range of motion. Neck supple. No thyromegaly present.  Cardiovascular: Normal rate, regular rhythm, normal heart sounds and intact distal pulses.   No murmur  heard. Pulmonary/Chest: Effort normal and breath sounds normal. No respiratory distress. She has no wheezes.  Abdominal: Soft. Bowel sounds are normal. She exhibits no distension. There is no tenderness.  Musculoskeletal: Normal range of motion. She exhibits no edema or tenderness.  Neurological: She is alert and oriented to person, place, and time. She has normal reflexes. No cranial nerve deficit.  Skin: Skin is warm and dry.  Psychiatric: She has a normal mood and affect. Her behavior is normal. Judgment and thought content normal.  Vitals reviewed.     BP 112/78 mmHg  Pulse 77  Temp(Src) 98.1 F (36.7 C) (Oral)  Ht 5\' 3"  (1.6 m)  Wt 135 lb (61.236 kg)  BMI 23.92 kg/m2  LMP 05/11/2014     Assessment & Plan:  1. Allergic rhinitis, unspecified allergic rhinitis type  2. Acute upper respiratory infection - azithromycin (ZITHROMAX) 250 MG tablet; Take 500 mg once, then 250 mg for four days  Dispense: 6 tablet; Refill: 0  3. Acute pharyngitis, unspecified pharyngitis type - azithromycin (ZITHROMAX) 250 MG tablet; Take 500 mg once, then 250 mg for four days  Dispense: 6 tablet; Refill: 0  - Take meds as prescribed - Use a cool mist humidifier  -Use saline nose sprays frequently -Saline irrigations of the nose can be very helpful if done frequently.  * 4X daily for 1 week*  * Use of a nettie pot can be helpful with this. Follow directions with this* -Force fluids -For any cough or congestion  Use plain Mucinex- regular strength or max strength is fine   * Children- consult with Pharmacist for dosing -For fever or aces or  pains- take tylenol or ibuprofen appropriate for age and weight.  * for fevers greater than 101 orally you may alternate ibuprofen and tylenol every  3 hours. -Throat lozenges if help -New toothbrush in 3 days -Continue Singulair -Do not rub eyes!!!   Jannifer Rodney, FNP

## 2014-06-18 ENCOUNTER — Encounter: Payer: Self-pay | Admitting: Family Medicine

## 2014-06-18 ENCOUNTER — Ambulatory Visit (INDEPENDENT_AMBULATORY_CARE_PROVIDER_SITE_OTHER): Payer: 59 | Admitting: Family Medicine

## 2014-06-18 VITALS — BP 115/70 | HR 91 | Temp 97.0°F | Ht 63.0 in | Wt 138.4 lb

## 2014-06-18 DIAGNOSIS — N921 Excessive and frequent menstruation with irregular cycle: Secondary | ICD-10-CM | POA: Diagnosis not present

## 2014-06-18 DIAGNOSIS — N3090 Cystitis, unspecified without hematuria: Secondary | ICD-10-CM

## 2014-06-18 DIAGNOSIS — J301 Allergic rhinitis due to pollen: Secondary | ICD-10-CM | POA: Diagnosis not present

## 2014-06-18 DIAGNOSIS — R1030 Lower abdominal pain, unspecified: Secondary | ICD-10-CM

## 2014-06-18 DIAGNOSIS — J0111 Acute recurrent frontal sinusitis: Secondary | ICD-10-CM | POA: Diagnosis not present

## 2014-06-18 LAB — POCT UA - MICROSCOPIC ONLY
CRYSTALS, UR, HPF, POC: NEGATIVE
Casts, Ur, LPF, POC: NEGATIVE
Mucus, UA: NEGATIVE
WBC, UR, HPF, POC: NEGATIVE
Yeast, UA: NEGATIVE

## 2014-06-18 LAB — POCT URINALYSIS DIPSTICK
Bilirubin, UA: NEGATIVE
GLUCOSE UA: NEGATIVE
KETONES UA: NEGATIVE
Leukocytes, UA: NEGATIVE
NITRITE UA: POSITIVE
PH UA: 7.5
Protein, UA: NEGATIVE
Spec Grav, UA: 1.01
Urobilinogen, UA: NEGATIVE

## 2014-06-18 LAB — POCT URINE PREGNANCY: Preg Test, Ur: NEGATIVE

## 2014-06-18 MED ORDER — LEVOFLOXACIN 500 MG PO TABS
500.0000 mg | ORAL_TABLET | Freq: Every day | ORAL | Status: DC
Start: 2014-06-18 — End: 2014-09-21

## 2014-06-18 NOTE — Addendum Note (Signed)
Addended by: Prescott GumLAND, Alvah Lagrow M on: 06/18/2014 09:17 AM   Modules accepted: Orders, SmartSet

## 2014-06-18 NOTE — Patient Instructions (Signed)
Discontinue Zithromax/azithromycin. You may continue the montelukast however. In fact continue all other medicines as is. Take the full 10 days of the levofloxacin. Monitor for continued nausea and joint and tendon discomfort as side effects.  If vaginal discharge continues in spite of treatment please contact me at the office.

## 2014-06-18 NOTE — Progress Notes (Signed)
Subjective:  Patient ID: Kathryn Singh, female    DOB: 03/06/1976  Age: 39 y.o. MRN: [id number]  CC: Urinary Tract Infection   HPI Kathryn Singh presents for Symptoms include congestion, facial pain, left 4 head area. nasal congestion, no  fever, slight non productive cough, post nasal drip and sinus pressure with no fever, chills, night sweats or weight loss. Onset of symptoms was 2 weeks ago, gradually worsening since that time. Pt.is drinking moderate amounts of fluids.  Usually gets one twice a year in the spring and even worse in the fall.  Patient had acute onset last evening of lower abdominal cramping pain. She also noted some left flank pain at the same time. She points to the lower lumbar region on the left. Rather than the true flank however. She notes that she had nausea for a couple of days before that. However, she was also taking some Zithromax for a sinus infection at the time. She did have fever last night. She also reported some vaginal bleeding and it is not time for her menses. LMP March 16. Some vaginal DC last night.  History Kathryn Singh has a past medical history of GERD (gastroesophageal reflux disease).   She has past surgical history that includes Cesarean section.   Her family history includes Diabetes in her mother; Heart attack in her father.She reports that she has never smoked. She does not have any smokeless tobacco history on file. She reports that she does not drink alcohol or use illicit drugs.  Current Outpatient Prescriptions on File Prior to Visit  Medication Sig Dispense Refill  . montelukast (SINGULAIR) 10 MG tablet Take 1 tablet (10 mg total) by mouth at bedtime. 90 tablet 4  . omeprazole (PRILOSEC) 20 MG capsule Take 1 capsule (20 mg total) by mouth daily. 90 capsule 4  . simvastatin (ZOCOR) 40 MG tablet Take 1 tablet (40 mg total) by mouth at bedtime. 90 tablet 3  . Vitamin D, Ergocalciferol, (DRISDOL) 50000 UNITS CAPS capsule Take 1 capsule (50,000 Units  total) by mouth every 7 (seven) days. 12 capsule 3  . escitalopram (LEXAPRO) 10 MG tablet Take 1 tablet (10 mg total) by mouth daily. (Patient not taking: Reported on 06/18/2014) 90 tablet 3   No current facility-administered medications on file prior to visit.    ROS Review of Systems  Constitutional: Positive for fever and chills. Negative for diaphoresis, activity change and appetite change.  HENT: Positive for postnasal drip, rhinorrhea and sinus pressure. Negative for congestion, ear discharge, ear pain, hearing loss, nosebleeds, sneezing and trouble swallowing.   Eyes: Negative for visual disturbance.  Respiratory: Positive for cough (nonproductive). Negative for chest tightness and shortness of breath.   Cardiovascular: Negative for chest pain and palpitations.  Gastrointestinal: Negative for nausea, diarrhea and constipation.  Genitourinary: Positive for dysuria, urgency and frequency. Negative for hematuria, flank pain, decreased urine volume, menstrual problem and pelvic pain.  Musculoskeletal: Negative for joint swelling and arthralgias.  Skin: Negative for rash.  Neurological: Negative for dizziness and numbness.    Objective:  BP 115/70 mmHg  Pulse 91  Temp(Src) 97 F (36.1 C) (Oral)  Ht 5\' 3"  (1.6 m)  Wt 138 lb 6.4 oz (62.778 kg)  BMI 24.52 kg/m2  LMP 06/02/2014  BP Readings from Last 3 Encounters:  06/18/14 115/70  06/15/14 112/78  06/01/14 101/71    Wt Readings from Last 3 Encounters:  06/18/14 138 lb 6.4 oz (62.778 kg)  06/15/14 135 lb (61.236 kg)  06/01/14 142 lb 9.6 oz (64.683 kg)     Physical Exam  Constitutional: She is oriented to person, place, and time. She appears well-developed and well-nourished. No distress.  HENT:  Head: Normocephalic and atraumatic.  Right Ear: External ear normal.  Left Ear: External ear normal.  Nose: Nose normal.  Mouth/Throat: Oropharynx is clear and moist.  Eyes: Conjunctivae and EOM are normal. Pupils are equal,  round, and reactive to light.  Neck: Normal range of motion. Neck supple. No thyromegaly present.  Cardiovascular: Normal rate, regular rhythm and normal heart sounds.   No murmur heard. Pulmonary/Chest: Effort normal and breath sounds normal. No respiratory distress. She has no wheezes. She has no rales.  Abdominal: Soft. Bowel sounds are normal. She exhibits no distension and no mass. There is tenderness (suprapubic). There is no rebound and no guarding.  Lymphadenopathy:    She has no cervical adenopathy.  Neurological: She is alert and oriented to person, place, and time. She has normal reflexes.  Skin: Skin is warm and dry.  Psychiatric: She has a normal mood and affect. Her behavior is normal. Judgment and thought content normal.    No results found for: HGBA1C  Lab Results  Component Value Date   WBC 14.8* 11/30/2013   HGB 11.8* 11/30/2013   HCT 36.6* 11/30/2013   GLUCOSE 89 06/03/2014   CHOL 191 06/03/2014   TRIG 129 06/03/2014   HDL 41 06/03/2014   LDLCALC 124* 06/03/2014   ALT 20 06/03/2014   AST 14 06/03/2014   NA 139 06/03/2014   K 4.3 06/03/2014   CL 104 06/03/2014   CREATININE 0.64 06/03/2014   BUN 12 06/03/2014   CO2 21 06/03/2014   TSH 1.440 06/03/2014    No results found.  Assessment & Plan:   Kathryn Singh was seen today for urinary tract infection.  Diagnoses and all orders for this visit:  Lower abdominal pain Orders: -     POCT UA - Microscopic Only -     POCT urinalysis dipstick -     Urine culture  Allergic rhinitis due to pollen  Cystitis  Acute recurrent frontal sinusitis  Metrorrhagia  Other orders -     levofloxacin (LEVAQUIN) 500 MG tablet; Take 1 tablet (500 mg total) by mouth daily.   I have discontinued Ms. Fuhrmann's azithromycin. I am also having her start on levofloxacin. Additionally, I am having her maintain her omeprazole, montelukast, simvastatin, Vitamin D (Ergocalciferol), and escitalopram.  Meds ordered this encounter    Medications  . levofloxacin (LEVAQUIN) 500 MG tablet    Sig: Take 1 tablet (500 mg total) by mouth daily.    Dispense:  7 tablet    Refill:  0     Follow-up: Return if symptoms worsen or fail to improve.  Mechele Claude, M.D.

## 2014-06-20 LAB — URINE CULTURE

## 2014-06-21 NOTE — Addendum Note (Signed)
Addended by: Mechele ClaudeSTACKS, Hadas Jessop on: 06/21/2014 05:45 PM   Modules accepted: Kipp BroodSmartSet

## 2014-06-22 NOTE — Progress Notes (Signed)
lmtcb

## 2014-06-22 NOTE — Progress Notes (Signed)
Patient aware.

## 2014-09-21 ENCOUNTER — Ambulatory Visit (INDEPENDENT_AMBULATORY_CARE_PROVIDER_SITE_OTHER): Payer: 59 | Admitting: Family

## 2014-09-21 ENCOUNTER — Encounter: Payer: Self-pay | Admitting: Family

## 2014-09-21 VITALS — BP 112/66 | HR 90 | Temp 98.6°F | Ht 63.0 in | Wt 142.2 lb

## 2014-09-21 DIAGNOSIS — G4700 Insomnia, unspecified: Secondary | ICD-10-CM

## 2014-09-21 DIAGNOSIS — R1030 Lower abdominal pain, unspecified: Secondary | ICD-10-CM | POA: Diagnosis not present

## 2014-09-21 DIAGNOSIS — F411 Generalized anxiety disorder: Secondary | ICD-10-CM | POA: Diagnosis not present

## 2014-09-21 LAB — POCT UA - MICROSCOPIC ONLY
BACTERIA, U MICROSCOPIC: NEGATIVE
CASTS, UR, LPF, POC: NEGATIVE
CRYSTALS, UR, HPF, POC: NEGATIVE
RBC, URINE, MICROSCOPIC: NEGATIVE
WBC, Ur, HPF, POC: NEGATIVE
Yeast, UA: NEGATIVE

## 2014-09-21 LAB — POCT URINALYSIS DIPSTICK
Bilirubin, UA: NEGATIVE
GLUCOSE UA: NEGATIVE
Ketones, UA: NEGATIVE
LEUKOCYTES UA: NEGATIVE
Nitrite, UA: NEGATIVE
PROTEIN UA: NEGATIVE
RBC UA: NEGATIVE
SPEC GRAV UA: 1.02
Urobilinogen, UA: NEGATIVE
pH, UA: 5

## 2014-09-21 MED ORDER — ALPRAZOLAM 0.5 MG PO TABS: 0.5000 mg | ORAL_TABLET | Freq: Two times a day (BID) | ORAL | Status: DC | PRN

## 2014-09-21 MED ORDER — ESCITALOPRAM OXALATE 20 MG PO TABS: 20.0000 mg | ORAL_TABLET | Freq: Every day | ORAL | Status: DC

## 2014-09-21 NOTE — Patient Instructions (Signed)
Generalized Anxiety Disorder Generalized anxiety disorder (GAD) is a mental disorder. It interferes with life functions, including relationships, work, and school. GAD is different from normal anxiety, which everyone experiences at some point in their lives in response to specific life events and activities. Normal anxiety actually helps us prepare for and get through these life events and activities. Normal anxiety goes away after the event or activity is over.  GAD causes anxiety that is not necessarily related to specific events or activities. It also causes excess anxiety in proportion to specific events or activities. The anxiety associated with GAD is also difficult to control. GAD can vary from mild to severe. People with severe GAD can have intense waves of anxiety with physical symptoms (panic attacks).  SYMPTOMS The anxiety and worry associated with GAD are difficult to control. This anxiety and worry are related to many life events and activities and also occur more days than not for 6 months or longer. People with GAD also have three or more of the following symptoms (one or more in children):  Restlessness.   Fatigue.  Difficulty concentrating.   Irritability.  Muscle tension.  Difficulty sleeping or unsatisfying sleep. DIAGNOSIS GAD is diagnosed through an assessment by your health care provider. Your health care provider will ask you questions aboutyour mood,physical symptoms, and events in your life. Your health care provider may ask you about your medical history and use of alcohol or drugs, including prescription medicines. Your health care provider may also do a physical exam and blood tests. Certain medical conditions and the use of certain substances can cause symptoms similar to those associated with GAD. Your health care provider may refer you to a mental health specialist for further evaluation. TREATMENT The following therapies are usually used to treat GAD:    Medication. Antidepressant medication usually is prescribed for long-term daily control. Antianxiety medicines may be added in severe cases, especially when panic attacks occur.   Talk therapy (psychotherapy). Certain types of talk therapy can be helpful in treating GAD by providing support, education, and guidance. A form of talk therapy called cognitive behavioral therapy can teach you healthy ways to think about and react to daily life events and activities.  Stress managementtechniques. These include yoga, meditation, and exercise and can be very helpful when they are practiced regularly. A mental health specialist can help determine which treatment is best for you. Some people see improvement with one therapy. However, other people require a combination of therapies. Document Released: 06/30/2012 Document Revised: 07/20/2013 Document Reviewed: 06/30/2012 Exit[employer] Patient Information 2015 [employer]. This information is not intended to replace advice given to you by your health care provider. Make sure you discuss any questions you have with your health care provider.  
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
## 2014-10-12 ENCOUNTER — Other Ambulatory Visit: Payer: Self-pay | Admitting: *Deleted

## 2015-03-08 ENCOUNTER — Ambulatory Visit (INDEPENDENT_AMBULATORY_CARE_PROVIDER_SITE_OTHER): Payer: BLUE CROSS/BLUE SHIELD | Admitting: Pediatrics

## 2015-03-08 ENCOUNTER — Encounter: Payer: Self-pay | Admitting: Pediatrics

## 2015-03-08 VITALS — BP 109/74 | HR 80 | Temp 98.4°F | Ht 63.0 in | Wt 145.6 lb

## 2015-03-08 DIAGNOSIS — E785 Hyperlipidemia, unspecified: Secondary | ICD-10-CM | POA: Diagnosis not present

## 2015-03-08 DIAGNOSIS — J019 Acute sinusitis, unspecified: Secondary | ICD-10-CM

## 2015-03-08 DIAGNOSIS — K219 Gastro-esophageal reflux disease without esophagitis: Secondary | ICD-10-CM

## 2015-03-08 DIAGNOSIS — J069 Acute upper respiratory infection, unspecified: Secondary | ICD-10-CM

## 2015-03-08 MED ORDER — AMOXICILLIN-POT CLAVULANATE 875-125 MG PO TABS: 1.0000 | ORAL_TABLET | Freq: Two times a day (BID) | ORAL | Status: DC

## 2015-03-08 MED ORDER — OMEPRAZOLE 20 MG PO CPDR: 20.0000 mg | DELAYED_RELEASE_CAPSULE | Freq: Every day | ORAL | Status: DC

## 2015-03-08 MED ORDER — SIMVASTATIN 40 MG PO TABS: 40.0000 mg | ORAL_TABLET | Freq: Every day | ORAL | Status: DC

## 2015-03-08 NOTE — Patient Instructions (Signed)
Start antibiotics if starts to get worse

## 2015-03-08 NOTE — Progress Notes (Signed)
Subjective:    Patient ID: Kathryn Singh, female    DOB: 10/05/1975, 39 y.o.   MRN: [id number]  CC: Ear Pain and Sore Throat   HPI: Kathryn Singh is a 39 y.o. female presenting for Ear Pain and Sore Throat  Three weeks has had ear pain, sore throat, has been getting better and worse, yesterday was worse than yesterday Started getting worse again three days ago Lots of nasal discharge No fevers Normal appetite Other sick contacts including daughter Ears feel stopped up Breathing is normal   [facility] 03/08/2015 06/01/2014  Decreased Interest 0 2  Down, Depressed, Hopeless 0 2  PHQ - 2 Score 0 4  Altered sleeping - 2  Tired, decreased energy - 1  Change in appetite - 1  Feeling bad or failure about yourself  - 0  Trouble concentrating - 3  Moving slowly or fidgety/restless - 0  Suicidal thoughts - 0  PHQ-9 Score - 11     Relevant past medical, surgical, family and social history reviewed and updated as indicated. Interim medical history since our last visit reviewed. Allergies and medications reviewed and updated.    ROS: Per HPI unless specifically indicated above  History  Smoking status  . Never Smoker   Smokeless tobacco  . Not on file    Past Medical History Patient Active Problem List   Diagnosis Date Noted  . GAD (generalized anxiety disorder) 09/21/2014  . Vitamin D deficiency 06/07/2014  . Hyperlipidemia 06/01/2014  . Allergic rhinitis 06/01/2014  . GERD (gastroesophageal reflux disease) 11/03/2013        Objective:    BP 109/74 mmHg  Pulse 80  Temp(Src) 98.4 F (36.9 C) (Oral)  Ht 5\' 3"  (1.6 m)  Wt 145 lb 9.6 oz (66.044 kg)  BMI 25.80 kg/m2  Wt Readings from Last 3 Encounters:  03/08/15 145 lb 9.6 oz (66.044 kg)  09/21/14 142 lb 3.2 oz (64.501 kg)  06/18/14 138 lb 6.4 oz (62.778 kg)     Gen: NAD, alert, cooperative with exam, NCAT EYES: EOMI, no scleral injection or icterus ENT:  TMs dull gray with nl LR b/l, OP  without erythema, no tenderness over sinuses LYMPH: no cervical LAD CV: NRRR, normal S1/S2, no murmur, distal pulses 2+ b/l Resp: CTABL, no wheezes, normal WOB Abd: +BS, soft, NTND. no guarding or organomegaly Ext: No edema, warm Neuro: Alert and oriented, strength equal b/l UE and LE, coordination grossly normal MSK: normal muscle bulk     Assessment & Plan:    Kathryn Singh was seen today for ear pain and sore throat, likely due to acute URI. Feels better today than she did yesterday. As symptoms have been intermittently ongoing for past 3 weeks, gave paper Rx for antibiotics as below, fill if starts to worsen again to treat acute sinusiitis.  Diagnoses and all orders for this visit:  Acute URI Discussed symptomatic care  Hyperlipidemia Non-compliant recently, fill below. RTC for CPE soon. -     simvastatin (ZOCOR) 40 MG tablet; Take 1 tablet (40 mg total) by mouth at bedtime.  Gastroesophageal reflux disease without esophagitis Symptoms wel-controlled if has below, continue -     omeprazole (PRILOSEC) 20 MG capsule; Take 1 capsule (20 mg total) by mouth daily.  Acute sinusitis, recurrence not specified, unspecified location -     amoxicillin-clavulanate (AUGMENTIN) 875-125 MG tablet; Take 1 tablet by mouth 2 (two) times daily.   Follow up plan: Return in about 4  weeks (around 04/05/2015) for CPE.  Rex Kras, MD [facility] 03/08/2015, 4:54 PM

## 2015-04-13 ENCOUNTER — Ambulatory Visit: Payer: BLUE CROSS/BLUE SHIELD

## 2015-05-13 ENCOUNTER — Encounter: Payer: Self-pay | Admitting: Family

## 2015-05-13 ENCOUNTER — Ambulatory Visit (INDEPENDENT_AMBULATORY_CARE_PROVIDER_SITE_OTHER): Payer: BLUE CROSS/BLUE SHIELD | Admitting: Family

## 2015-05-13 VITALS — BP 121/76 | HR 74 | Temp 97.4°F | Ht 63.0 in | Wt 143.4 lb

## 2015-05-13 DIAGNOSIS — F411 Generalized anxiety disorder: Secondary | ICD-10-CM

## 2015-05-13 DIAGNOSIS — G4700 Insomnia, unspecified: Secondary | ICD-10-CM

## 2015-05-13 DIAGNOSIS — Z0000 Encounter for general adult medical examination without abnormal findings: Secondary | ICD-10-CM | POA: Diagnosis not present

## 2015-05-13 DIAGNOSIS — Z23 Encounter for immunization: Secondary | ICD-10-CM | POA: Diagnosis not present

## 2015-05-13 DIAGNOSIS — E559 Vitamin D deficiency, unspecified: Secondary | ICD-10-CM

## 2015-05-13 DIAGNOSIS — K219 Gastro-esophageal reflux disease without esophagitis: Secondary | ICD-10-CM

## 2015-05-13 DIAGNOSIS — Z01419 Encounter for gynecological examination (general) (routine) without abnormal findings: Secondary | ICD-10-CM

## 2015-05-13 DIAGNOSIS — J301 Allergic rhinitis due to pollen: Secondary | ICD-10-CM

## 2015-05-13 DIAGNOSIS — E785 Hyperlipidemia, unspecified: Secondary | ICD-10-CM

## 2015-05-13 LAB — POCT URINALYSIS DIPSTICK
Bilirubin, UA: NEGATIVE
Glucose, UA: NEGATIVE
Ketones, UA: NEGATIVE
NITRITE UA: NEGATIVE
PH UA: 6
PROTEIN UA: NEGATIVE
Spec Grav, UA: 1.01
UROBILINOGEN UA: NEGATIVE

## 2015-05-13 LAB — POCT UA - MICROSCOPIC ONLY
Casts, Ur, LPF, POC: NEGATIVE
Crystals, Ur, HPF, POC: NEGATIVE
Mucus, UA: NEGATIVE
Yeast, UA: NEGATIVE

## 2015-05-13 MED ORDER — ESCITALOPRAM OXALATE 20 MG PO TABS: 20.0000 mg | ORAL_TABLET | Freq: Every day | ORAL | Status: DC

## 2015-05-13 MED ORDER — SIMVASTATIN 40 MG PO TABS: 40.0000 mg | ORAL_TABLET | Freq: Every day | ORAL | Status: DC

## 2015-05-13 MED ORDER — FLUTICASONE PROPIONATE 50 MCG/ACT NA SUSP
2.0000 | Freq: Every day | NASAL | Status: DC
Start: 2015-05-13 — End: 2021-01-26

## 2015-05-13 MED ORDER — OMEPRAZOLE 20 MG PO CPDR: 20.0000 mg | DELAYED_RELEASE_CAPSULE | Freq: Every day | ORAL | Status: DC

## 2015-05-13 NOTE — Progress Notes (Signed)
Subjective:    Patient ID: Kathryn Singh, female    DOB: August 23, 1975, 40 y.o.   MRN: [id number]  Pt presents to the office today for CPE with pap. Pt has the following chronic conditions. PT states she just got a new job and for the last 6 months patient has not taken any of her medications related to her insurance.  Gynecologic Exam Pertinent negatives include no headaches, nausea or sore throat.  Hyperlipidemia This is a chronic problem. The current episode started more than 1 year ago. The problem is uncontrolled. Recent lipid tests were reviewed and are high. She has no history of diabetes or hypothyroidism. Pertinent negatives include no chest pain, leg pain, myalgias or shortness of breath. Current antihyperlipidemic treatment includes statins and diet change. The current treatment provides no improvement of lipids. Risk factors for coronary artery disease include dyslipidemia, family history and a sedentary lifestyle.  Gastroesophageal Reflux She complains of heartburn. She reports no chest pain, no coughing, no nausea, no sore throat or no wheezing. This is a chronic problem. The current episode started more than 1 year ago. The problem occurs rarely. The problem has been resolved. The symptoms are aggravated by certain foods. She has tried an antacid for the symptoms. The treatment provided mild relief.  Anxiety Presents for follow-up visit. Onset was 1 to 5 years ago. The problem has been waxing and waning. Symptoms include decreased concentration, excessive worry, insomnia and nervous/anxious behavior. Patient reports no chest pain, nausea, palpitations or shortness of breath. Symptoms occur occasionally.   Her past medical history is significant for anxiety/panic attacks and depression. Past treatments include nothing. The treatment provided no relief. Compliance with prior treatments has been good.      Review of Systems  Constitutional: Negative.   HENT: Negative.  Negative for  sore throat.   Eyes: Negative.   Respiratory: Negative.  Negative for cough, shortness of breath and wheezing.   Cardiovascular: Negative.  Negative for chest pain and palpitations.  Gastrointestinal: Positive for heartburn. Negative for nausea.  Endocrine: Positive for heat intolerance.  Genitourinary: Negative.   Musculoskeletal: Negative.  Negative for myalgias.  Neurological: Negative.  Negative for headaches.  Hematological: Negative.   Psychiatric/Behavioral: Positive for decreased concentration. The patient is nervous/anxious and has insomnia.   All other systems reviewed and are negative.      Objective:   Physical Exam  Constitutional: She is oriented to person, place, and time. She appears well-developed and well-nourished. No distress.  HENT:  Head: Normocephalic and atraumatic.  Right Ear: External ear normal.  Left Ear: External ear normal.  Nose: Nose normal.  Mouth/Throat: Oropharynx is clear and moist.  Eyes: Pupils are equal, round, and reactive to light.  Neck: Normal range of motion. Neck supple. No thyromegaly present.  Cardiovascular: Normal rate, regular rhythm, normal heart sounds and intact distal pulses.   No murmur heard. Pulmonary/Chest: Effort normal and breath sounds normal. No respiratory distress. She has no wheezes. Right breast exhibits no inverted nipple, no mass, no nipple discharge, no skin change and no tenderness. Left breast exhibits no inverted nipple, no mass, no nipple discharge, no skin change and no tenderness. Breasts are symmetrical.  Abdominal: Soft. Bowel sounds are normal. She exhibits no distension. There is no tenderness.  Genitourinary: Vagina normal.  Bimanual exam- no adnexal masses or tenderness, ovaries nonpalpable   Cervix parous and pink- No discharge, small polyp noted on cervix    Musculoskeletal: Normal range of motion. She exhibits  no edema or tenderness.  Neurological: She is alert and oriented to person, place, and  time. She has normal reflexes. No cranial nerve deficit.  Skin: Skin is warm and dry.  Psychiatric: She has a normal mood and affect. Her behavior is normal. Judgment and thought content normal.  Vitals reviewed.   BP 121/76 mmHg  Pulse 74  Temp(Src) 97.4 F (36.3 C) (Oral)  Ht '5\' 3"'  (1.6 m)  Wt 143 lb 6.4 oz (65.046 kg)  BMI 25.41 kg/m2       Assessment & Plan:  1. Encounter for routine gynecological examination - POCT urinalysis dipstick - POCT UA - Microscopic Only - CMP14+EGFR - Pap IG w/ reflex to HPV when ASC-U  2. Allergic rhinitis due to pollen - CMP14+EGFR -Flonase ordered today  3. Gastroesophageal reflux disease without esophagitis - CMP14+EGFR - omeprazole (PRILOSEC) 20 MG capsule; Take 1 capsule (20 mg total) by mouth daily.  Dispense: 90 capsule; Refill: 1  4. Hyperlipidemia - CMP14+EGFR - Lipid panel - simvastatin (ZOCOR) 40 MG tablet; Take 1 tablet (40 mg total) by mouth at bedtime.  Dispense: 90 tablet; Refill: 1  5. Vitamin D deficiency - CMP14+EGFR  6. GAD (generalized anxiety disorder) - CMP14+EGFR - escitalopram (LEXAPRO) 20 MG tablet; Take 1 tablet (20 mg total) by mouth daily.  Dispense: 90 tablet; Refill: 3  7. Insomnia - CMP14+EGFR - escitalopram (LEXAPRO) 20 MG tablet; Take 1 tablet (20 mg total) by mouth daily.  Dispense: 90 tablet; Refill: 3  8. Annual physical exam - Anemia Profile B - CMP14+EGFR - Lipid panel - Thyroid Panel With TSH - VITAMIN D 25 Hydroxy (Vit-D Deficiency, Fractures) - Pap IG w/ reflex to HPV when ASC-U   Continue all meds Labs pending Health Maintenance reviewed- TDAP given today Diet and exercise encouraged RTO 6 months  Evelina Dun, FNP

## 2015-05-13 NOTE — Patient Instructions (Signed)
Health Maintenance, Female Adopting a healthy lifestyle and getting preventive care can go a long way to promote health and wellness. Talk with your health care provider about what schedule of regular examinations is right for you. This is a good chance for you to check in with your provider about disease prevention and staying healthy. In between checkups, there are plenty of things you can do on your own. Experts have done a lot of research about which lifestyle changes and preventive measures are most likely to keep you healthy. Ask your health care provider for more information. WEIGHT AND DIET  Eat a healthy diet  Be sure to include plenty of vegetables, fruits, low-fat dairy products, and lean protein.  Do not eat a lot of foods high in solid fats, added sugars, or salt.  Get regular exercise. This is one of the most important things you can do for your health.  Most adults should exercise for at least 150 minutes each week. The exercise should increase your heart rate and make you sweat (moderate-intensity exercise).  Most adults should also do strengthening exercises at least twice a week. This is in addition to the moderate-intensity exercise.  Maintain a healthy weight  Body mass index (BMI) is a measurement that can be used to identify possible weight problems. It estimates body fat based on height and weight. Your health care provider can help determine your BMI and help you achieve or maintain a healthy weight.  For females 20 years of age and older:   A BMI below 18.5 is considered underweight.  A BMI of 18.5 to 24.9 is normal.  A BMI of 25 to 29.9 is considered overweight.  A BMI of 30 and above is considered obese.  Watch levels of cholesterol and blood lipids  You should start having your blood tested for lipids and cholesterol at 40 years of age, then have this test every 5 years.  You may need to have your cholesterol levels checked more often if:  Your lipid  or cholesterol levels are high.  You are older than 40 years of age.  You are at high risk for heart disease.  CANCER SCREENING   Lung Cancer  Lung cancer screening is recommended for adults 55-80 years old who are at high risk for lung cancer because of a history of smoking.  A yearly low-dose CT scan of the lungs is recommended for people who:  Currently smoke.  Have quit within the past 15 years.  Have at least a 30-pack-year history of smoking. A pack year is smoking an average of one pack of cigarettes a day for 1 year.  Yearly screening should continue until it has been 15 years since you quit.  Yearly screening should stop if you develop a health problem that would prevent you from having lung cancer treatment.  Breast Cancer  Practice breast self-awareness. This means understanding how your breasts normally appear and feel.  It also means doing regular breast self-exams. Let your health care provider know about any changes, no matter how small.  If you are in your 20s or 30s, you should have a clinical breast exam (CBE) by a health care provider every 1-3 years as part of a regular health exam.  If you are 40 or older, have a CBE every year. Also consider having a breast X-ray (mammogram) every year.  If you have a family history of breast cancer, talk to your health care provider about genetic screening.  If you   are at high risk for breast cancer, talk to your health care provider about having an MRI and a mammogram every year.  Breast cancer gene (BRCA) assessment is recommended for women who have family members with BRCA-related cancers. BRCA-related cancers include:  Breast.  Ovarian.  Tubal.  Peritoneal cancers.  Results of the assessment will determine the need for genetic counseling and BRCA1 and BRCA2 testing. Cervical Cancer Your health care provider may recommend that you be screened regularly for cancer of the pelvic organs (ovaries, uterus, and  vagina). This screening involves a pelvic examination, including checking for microscopic changes to the surface of your cervix (Pap test). You may be encouraged to have this screening done every 3 years, beginning at age 21.  For women ages 30-65, health care providers may recommend pelvic exams and Pap testing every 3 years, or they may recommend the Pap and pelvic exam, combined with testing for human papilloma virus (HPV), every 5 years. Some types of HPV increase your risk of cervical cancer. Testing for HPV may also be done on women of any age with unclear Pap test results.  Other health care providers may not recommend any screening for nonpregnant women who are considered low risk for pelvic cancer and who do not have symptoms. Ask your health care provider if a screening pelvic exam is right for you.  If you have had past treatment for cervical cancer or a condition that could lead to cancer, you need Pap tests and screening for cancer for at least 20 years after your treatment. If Pap tests have been discontinued, your risk factors (such as having a new sexual partner) need to be reassessed to determine if screening should resume. Some women have medical problems that increase the chance of getting cervical cancer. In these cases, your health care provider may recommend more frequent screening and Pap tests. Colorectal Cancer  This type of cancer can be detected and often prevented.  Routine colorectal cancer screening usually begins at 40 years of age and continues through 40 years of age.  Your health care provider may recommend screening at an earlier age if you have risk factors for colon cancer.  Your health care provider may also recommend using home test kits to check for hidden blood in the stool.  A small camera at the end of a tube can be used to examine your colon directly (sigmoidoscopy or colonoscopy). This is done to check for the earliest forms of colorectal  cancer.  Routine screening usually begins at age 50.  Direct examination of the colon should be repeated every 5-10 years through 40 years of age. However, you may need to be screened more often if early forms of precancerous polyps or small growths are found. Skin Cancer  Check your skin from head to toe regularly.  Tell your health care provider about any new moles or changes in moles, especially if there is a change in a mole's shape or color.  Also tell your health care provider if you have a mole that is larger than the size of a pencil eraser.  Always use sunscreen. Apply sunscreen liberally and repeatedly throughout the day.  Protect yourself by wearing long sleeves, pants, a wide-brimmed hat, and sunglasses whenever you are outside. HEART DISEASE, DIABETES, AND HIGH BLOOD PRESSURE   High blood pressure causes heart disease and increases the risk of stroke. High blood pressure is more likely to develop in:  People who have blood pressure in the high end   of the normal range (130-139/85-89 mm Hg).  People who are overweight or obese.  People who are African American.  If you are 38-23 years of age, have your blood pressure checked every 3-5 years. If you are 61 years of age or older, have your blood pressure checked every year. You should have your blood pressure measured twice--once when you are at a hospital or clinic, and once when you are not at a hospital or clinic. Record the average of the two measurements. To check your blood pressure when you are not at a hospital or clinic, you can use:  An automated blood pressure machine at a pharmacy.  A home blood pressure monitor.  If you are between 45 years and 39 years old, ask your health care provider if you should take aspirin to prevent strokes.  Have regular diabetes screenings. This involves taking a blood sample to check your fasting blood sugar level.  If you are at a normal weight and have a low risk for diabetes,  have this test once every three years after 40 years of age.  If you are overweight and have a high risk for diabetes, consider being tested at a younger age or more often. PREVENTING INFECTION  Hepatitis B  If you have a higher risk for hepatitis B, you should be screened for this virus. You are considered at high risk for hepatitis B if:  You were born in a country where hepatitis B is common. Ask your health care provider which countries are considered high risk.  Your parents were born in a high-risk country, and you have not been immunized against hepatitis B (hepatitis B vaccine).  You have HIV or AIDS.  You use needles to inject street drugs.  You live with someone who has hepatitis B.  You have had sex with someone who has hepatitis B.  You get hemodialysis treatment.  You take certain medicines for conditions, including cancer, organ transplantation, and autoimmune conditions. Hepatitis C  Blood testing is recommended for:  Everyone born from [birth year] through 1965.  Anyone with known risk factors for hepatitis C. Sexually transmitted infections (STIs)  You should be screened for sexually transmitted infections (STIs) including gonorrhea and chlamydia if:  You are sexually active and are younger than 40 years of age.  You are older than 40 years of age and your health care provider tells you that you are at risk for this type of infection.  Your sexual activity has changed since you were last screened and you are at an increased risk for chlamydia or gonorrhea. Ask your health care provider if you are at risk.  If you do not have HIV, but are at risk, it may be recommended that you take a prescription medicine daily to prevent HIV infection. This is called pre-exposure prophylaxis (PrEP). You are considered at risk if:  You are sexually active and do not regularly use condoms or know the HIV status of your partner(s).  You take drugs by injection.  You are sexually  active with a partner who has HIV. Talk with your health care provider about whether you are at high risk of being infected with HIV. If you choose to begin PrEP, you should first be tested for HIV. You should then be tested every 3 months for as long as you are taking PrEP.  PREGNANCY   If you are premenopausal and you may become pregnant, ask your health care provider about preconception counseling.  If you may  become pregnant, take 400 to 800 micrograms (mcg) of folic acid every day.  If you want to prevent pregnancy, talk to your health care provider about birth control (contraception). OSTEOPOROSIS AND MENOPAUSE   Osteoporosis is a disease in which the bones lose minerals and strength with aging. This can result in serious bone fractures. Your risk for osteoporosis can be identified using a bone density scan.  If you are 61 years of age or older, or if you are at risk for osteoporosis and fractures, ask your health care provider if you should be screened.  Ask your health care provider whether you should take a calcium or vitamin D supplement to lower your risk for osteoporosis.  Menopause may have certain physical symptoms and risks.  Hormone replacement therapy may reduce some of these symptoms and risks. Talk to your health care provider about whether hormone replacement therapy is right for you.  HOME CARE INSTRUCTIONS   Schedule regular health, dental, and eye exams.  Stay current with your immunizations.   Do not use any tobacco products including cigarettes, chewing tobacco, or electronic cigarettes.  If you are pregnant, do not drink alcohol.  If you are breastfeeding, limit how much and how often you drink alcohol.  Limit alcohol intake to no more than 1 drink per day for nonpregnant women. One drink equals 12 ounces of beer, 5 ounces of wine, or 1 ounces of hard liquor.  Do not use street drugs.  Do not share needles.  Ask your health care provider for help if  you need support or information about quitting drugs.  Tell your health care provider if you often feel depressed.  Tell your health care provider if you have ever been abused or do not feel safe at home.   This information is not intended to replace advice given to you by your health care provider. Make sure you discuss any questions you have with your health care provider.   Document Released: 09/18/2010 Document Revised: 03/26/2014 Document Reviewed: 02/04/2013 Elsevier Interactive Patient Education [employer].

## 2015-05-14 LAB — LIPID PANEL
Chol/HDL Ratio: 5.4 ratio units — ABNORMAL HIGH (ref 0.0–4.4)
Cholesterol, Total: 205 mg/dL — ABNORMAL HIGH (ref 100–199)
HDL: 38 mg/dL — AB (ref >39)
LDL Calculated: 124 mg/dL — ABNORMAL HIGH (ref 0–99)
Triglycerides: 213 mg/dL — ABNORMAL HIGH (ref 0–149)
VLDL Cholesterol Cal: 43 mg/dL — ABNORMAL HIGH (ref 5–40)

## 2015-05-14 LAB — ANEMIA PROFILE B
BASOS: 0 %
Basophils Absolute: 0 10*3/uL (ref 0.0–0.2)
EOS (ABSOLUTE): 0.1 10*3/uL (ref 0.0–0.4)
Eos: 2 %
FERRITIN: 9 ng/mL — AB (ref 15–150)
Folate: 15.5 ng/mL (ref >3.0)
HEMATOCRIT: 34.7 % (ref 34.0–46.6)
Hemoglobin: 11.3 g/dL (ref 11.1–15.9)
IRON SATURATION: 24 % (ref 15–55)
Immature Grans (Abs): 0 10*3/uL (ref 0.0–0.1)
Immature Granulocytes: 0 %
Iron: 96 ug/dL (ref 27–159)
Lymphocytes Absolute: 1.8 10*3/uL (ref 0.7–3.1)
Lymphs: 29 %
MCH: 27.2 pg (ref 26.6–33.0)
MCHC: 32.6 g/dL (ref 31.5–35.7)
MCV: 84 fL (ref 79–97)
MONOS ABS: 0.4 10*3/uL (ref 0.1–0.9)
Monocytes: 6 %
NEUTROS ABS: 3.9 10*3/uL (ref 1.4–7.0)
Neutrophils: 63 %
Platelets: 333 10*3/uL (ref 150–379)
RBC: 4.15 x10E6/uL (ref 3.77–5.28)
RDW: 14.8 % (ref 12.3–15.4)
Retic Ct Pct: 1.1 % (ref 0.6–2.6)
Total Iron Binding Capacity: 402 ug/dL (ref 250–450)
UIBC: 306 ug/dL (ref 131–425)
VITAMIN B 12: 601 pg/mL (ref 211–946)
WBC: 6.2 10*3/uL (ref 3.4–10.8)

## 2015-05-14 LAB — CMP14+EGFR
A/G RATIO: 1.5 (ref 1.1–2.5)
ALT: 20 IU/L (ref 0–32)
AST: 20 IU/L (ref 0–40)
Albumin: 4.2 g/dL (ref 3.5–5.5)
Alkaline Phosphatase: 77 IU/L (ref 39–117)
BILIRUBIN TOTAL: 0.5 mg/dL (ref 0.0–1.2)
BUN/Creatinine Ratio: 15 (ref 8–20)
BUN: 9 mg/dL (ref 6–20)
CHLORIDE: 103 mmol/L (ref 96–106)
CO2: 23 mmol/L (ref 18–29)
Calcium: 9.3 mg/dL (ref 8.7–10.2)
Creatinine, Ser: 0.62 mg/dL (ref 0.57–1.00)
GFR calc non Af Amer: 114 mL/min/{1.73_m2} (ref >59)
GFR, EST AFRICAN AMERICAN: 131 mL/min/{1.73_m2} (ref >59)
GLOBULIN, TOTAL: 2.8 g/dL (ref 1.5–4.5)
Glucose: 94 mg/dL (ref 65–99)
POTASSIUM: 4.2 mmol/L (ref 3.5–5.2)
SODIUM: 141 mmol/L (ref 134–144)
TOTAL PROTEIN: 7 g/dL (ref 6.0–8.5)

## 2015-05-14 LAB — VITAMIN D 25 HYDROXY (VIT D DEFICIENCY, FRACTURES): VIT D 25 HYDROXY: 19.9 ng/mL — AB (ref 30.0–100.0)

## 2015-05-14 LAB — THYROID PANEL WITH TSH
FREE THYROXINE INDEX: 2.5 (ref 1.2–4.9)
T3 UPTAKE RATIO: 29 % (ref 24–39)
T4, Total: 8.7 ug/dL (ref 4.5–12.0)
TSH: 1.36 u[IU]/mL (ref 0.450–4.500)

## 2015-05-18 LAB — PAP IG W/ RFLX HPV ASCU: PAP Smear Comment: 0

## 2015-05-19 ENCOUNTER — Other Ambulatory Visit: Payer: Self-pay | Admitting: Family

## 2015-05-19 DIAGNOSIS — E559 Vitamin D deficiency, unspecified: Secondary | ICD-10-CM

## 2015-05-19 MED ORDER — VITAMIN D (ERGOCALCIFEROL) 1.25 MG (50000 UNIT) PO CAPS: 50000.0000 [IU] | ORAL_CAPSULE | ORAL | Status: DC

## 2015-06-01 ENCOUNTER — Telehealth: Payer: Self-pay | Admitting: Family

## 2015-06-01 MED ORDER — SULFAMETHOXAZOLE-TRIMETHOPRIM 800-160 MG PO TABS: 1.0000 | ORAL_TABLET | Freq: Two times a day (BID) | ORAL | Status: DC

## 2015-06-01 NOTE — Progress Notes (Signed)
Patient aware to restart zocor, pick up rx for vit d and wants antibiotic sent to pharmacy.

## 2015-06-01 NOTE — Telephone Encounter (Signed)
Detailed message left for patient.

## 2015-06-01 NOTE — Telephone Encounter (Signed)
Prescription sent to pharmacy.

## 2015-11-17 ENCOUNTER — Encounter (INDEPENDENT_AMBULATORY_CARE_PROVIDER_SITE_OTHER): Payer: Self-pay

## 2015-11-17 ENCOUNTER — Ambulatory Visit (INDEPENDENT_AMBULATORY_CARE_PROVIDER_SITE_OTHER): Payer: BLUE CROSS/BLUE SHIELD | Admitting: Family

## 2015-11-17 ENCOUNTER — Encounter: Payer: Self-pay | Admitting: Family

## 2015-11-17 VITALS — BP 107/68 | HR 67 | Temp 97.1°F | Ht 63.0 in | Wt 147.6 lb

## 2015-11-17 DIAGNOSIS — K219 Gastro-esophageal reflux disease without esophagitis: Secondary | ICD-10-CM

## 2015-11-17 DIAGNOSIS — F411 Generalized anxiety disorder: Secondary | ICD-10-CM

## 2015-11-17 DIAGNOSIS — E559 Vitamin D deficiency, unspecified: Secondary | ICD-10-CM

## 2015-11-17 DIAGNOSIS — E785 Hyperlipidemia, unspecified: Secondary | ICD-10-CM

## 2015-11-17 DIAGNOSIS — Z713 Dietary counseling and surveillance: Secondary | ICD-10-CM | POA: Diagnosis not present

## 2015-11-17 DIAGNOSIS — J301 Allergic rhinitis due to pollen: Secondary | ICD-10-CM

## 2015-11-17 MED ORDER — RANITIDINE HCL 150 MG PO TABS: 150.0000 mg | ORAL_TABLET | Freq: Two times a day (BID) | ORAL | 6 refills | Status: DC

## 2015-11-17 MED ORDER — PHENTERMINE HCL 37.5 MG PO TABS: 37.5000 mg | ORAL_TABLET | Freq: Every day | ORAL | 2 refills | Status: DC

## 2015-11-17 MED ORDER — CITALOPRAM HYDROBROMIDE 40 MG PO TABS: 40.0000 mg | ORAL_TABLET | Freq: Every day | ORAL | 5 refills | Status: DC

## 2015-11-17 MED ORDER — LOVASTATIN 20 MG PO TABS: 20.0000 mg | ORAL_TABLET | Freq: Every day | ORAL | 3 refills | Status: DC

## 2015-11-17 NOTE — Patient Instructions (Signed)

## 2015-11-17 NOTE — Progress Notes (Signed)
Subjective:    Patient ID: Kathryn Singh, female    DOB: Oct 06, 1975, 40 y.o.   MRN: [id number]  Pt presents to the office today for chronic follow up. PT states all of her medications are too expensive and would like to discuss trying to changes some of them. PT is also complaining of trouble of decreasing weight. Pt states she has tried to lose weight with diet and exercise over the last year. Pt states she has only been able to lose about 3 lbs.    Hyperlipidemia  This is a chronic problem. The current episode started more than 1 year ago. The problem is uncontrolled. Recent lipid tests were reviewed and are high. Exacerbating diseases include obesity. Pertinent negatives include no chest pain or shortness of breath. Current antihyperlipidemic treatment includes statins. The current treatment provides moderate improvement of lipids. Risk factors for coronary artery disease include dyslipidemia, obesity, a sedentary lifestyle and family history.  Gastroesophageal Reflux  She reports no chest pain, no coughing, no hoarse voice, no tooth decay or no water brash. This is a chronic problem. The current episode started more than 1 year ago. The problem occurs occasionally. The problem has been waxing and waning. The symptoms are aggravated by lying down. She has tried a PPI for the symptoms. The treatment provided moderate relief.  Anxiety  Presents for follow-up visit. Patient reports no chest pain, decreased concentration, depressed mood, excessive worry, insomnia, irritability, nervous/anxious behavior, palpitations, panic, restlessness or shortness of breath. Symptoms occur rarely.        Review of Systems  Constitutional: Positive for unexpected weight change. Negative for irritability.  HENT: Negative.  Negative for hoarse voice.   Eyes: Negative.   Respiratory: Negative.  Negative for cough and shortness of breath.   Cardiovascular: Negative.  Negative for chest pain and palpitations.    Gastrointestinal: Negative.   Endocrine: Negative.   Genitourinary: Negative.   Musculoskeletal: Negative.   Neurological: Negative.  Negative for headaches.  Hematological: Negative.   Psychiatric/Behavioral: Negative.  Negative for decreased concentration. The patient is not nervous/anxious and does not have insomnia.   All other systems reviewed and are negative.      Objective:   Physical Exam  Constitutional: She is oriented to person, place, and time. She appears well-developed and well-nourished. No distress.  HENT:  Head: Normocephalic and atraumatic.  Right Ear: External ear normal.  Mouth/Throat: Oropharynx is clear and moist.  Eyes: Pupils are equal, round, and reactive to light.  Neck: Normal range of motion. Neck supple. No thyromegaly present.  Cardiovascular: Normal rate, regular rhythm, normal heart sounds and intact distal pulses.   No murmur heard. Pulmonary/Chest: Effort normal and breath sounds normal. No respiratory distress. She has no wheezes.  Abdominal: Soft. Bowel sounds are normal. She exhibits no distension. There is no tenderness.  Musculoskeletal: Normal range of motion. She exhibits no edema or tenderness.  Neurological: She is alert and oriented to person, place, and time. She has normal reflexes. No cranial nerve deficit.  Skin: Skin is warm and dry.  Psychiatric: She has a normal mood and affect. Her behavior is normal. Judgment and thought content normal.  Vitals reviewed.   BP 107/68   Pulse 67   Temp 97.1 F (36.2 C) (Oral)   Ht '5\' 3"'  (1.6 m)   Wt 147 lb 9.6 oz (67 kg)   BMI 26.15 kg/m        Assessment & Plan:  1. Gastroesophageal reflux disease without  esophagitis -Changed prilosec 20 mg to zantac today related to cost -Diet discussed- Avoid fried, spicy, citrus foods, caffeine and alcohol -Do not eat 2-3 hours before bedtime -Encouraged small frequent meals -Avoid NSAID's - ranitidine (ZANTAC) 150 MG tablet; Take 1 tablet  (150 mg total) by mouth 2 (two) times daily.  Dispense: 60 tablet; Refill: 6 - CMP14+EGFR  2. GAD (generalized anxiety disorder) -Changed lexapro 20 mg to celexa 40 mg today related to cost -Stress management discussed - citalopram (CELEXA) 40 MG tablet; Take 1 tablet (40 mg total) by mouth daily.  Dispense: 30 tablet; Refill: 5 - CMP14+EGFR  3. Hyperlipidemia -Changed zocor to Mevacor related to cost - lovastatin (MEVACOR) 20 MG tablet; Take 1 tablet (20 mg total) by mouth at bedtime.  Dispense: 90 tablet; Refill: 3 - CMP14+EGFR - Lipid panel  4. Vitamin D deficiency - CMP14+EGFR - VITAMIN D 25 Hydroxy (Vit-D Deficiency, Fractures)  5. Allergic rhinitis due to pollen - CMP14+EGFR  6. Weight loss counseling, encounter for -Discussed diet and exercise - phentermine (ADIPEX-P) 37.5 MG tablet; Take 1 tablet (37.5 mg total) by mouth daily before breakfast.  Dispense: 30 tablet; Refill: 2 - CMP14+EGFR   *Switch all medications to the $4 list at [facility].  Continue all meds Labs pending Health Maintenance reviewed Diet and exercise encouraged RTO 3 months to discuss weight loss  Evelina Dun, FNP

## 2015-11-18 LAB — LIPID PANEL
CHOL/HDL RATIO: 4.3 ratio (ref 0.0–4.4)
Cholesterol, Total: 147 mg/dL (ref 100–199)
HDL: 34 mg/dL — AB (ref >39)
LDL CALC: 68 mg/dL (ref 0–99)
TRIGLYCERIDES: 226 mg/dL — AB (ref 0–149)
VLDL CHOLESTEROL CAL: 45 mg/dL — AB (ref 5–40)

## 2015-11-18 LAB — CMP14+EGFR
ALT: 21 IU/L (ref 0–32)
AST: 16 IU/L (ref 0–40)
Albumin/Globulin Ratio: 1.4 (ref 1.2–2.2)
Albumin: 4 g/dL (ref 3.5–5.5)
Alkaline Phosphatase: 69 IU/L (ref 39–117)
BUN/Creatinine Ratio: 20 (ref 9–23)
BUN: 15 mg/dL (ref 6–24)
Bilirubin Total: 0.3 mg/dL (ref 0.0–1.2)
CALCIUM: 9.3 mg/dL (ref 8.7–10.2)
CO2: 24 mmol/L (ref 18–29)
CREATININE: 0.75 mg/dL (ref 0.57–1.00)
Chloride: 105 mmol/L (ref 96–106)
GFR calc Af Amer: 115 mL/min/{1.73_m2} (ref >59)
GFR, EST NON AFRICAN AMERICAN: 100 mL/min/{1.73_m2} (ref >59)
GLOBULIN, TOTAL: 2.9 g/dL (ref 1.5–4.5)
Glucose: 92 mg/dL (ref 65–99)
Potassium: 4.4 mmol/L (ref 3.5–5.2)
SODIUM: 143 mmol/L (ref 134–144)
Total Protein: 6.9 g/dL (ref 6.0–8.5)

## 2015-11-18 LAB — VITAMIN D 25 HYDROXY (VIT D DEFICIENCY, FRACTURES): Vit D, 25-Hydroxy: 25 ng/mL — ABNORMAL LOW (ref 30.0–100.0)

## 2015-11-18 NOTE — Progress Notes (Signed)
Patient aware.

## 2015-12-15 ENCOUNTER — Encounter: Payer: Self-pay | Admitting: Family

## 2015-12-15 ENCOUNTER — Ambulatory Visit (INDEPENDENT_AMBULATORY_CARE_PROVIDER_SITE_OTHER): Payer: BLUE CROSS/BLUE SHIELD | Admitting: Family

## 2015-12-15 VITALS — BP 101/75 | HR 90 | Temp 97.5°F | Ht 63.0 in | Wt 138.8 lb

## 2015-12-15 DIAGNOSIS — Z713 Dietary counseling and surveillance: Secondary | ICD-10-CM

## 2015-12-15 DIAGNOSIS — F411 Generalized anxiety disorder: Secondary | ICD-10-CM

## 2015-12-15 MED ORDER — PHENTERMINE HCL 37.5 MG PO TABS: 37.5000 mg | ORAL_TABLET | Freq: Every day | ORAL | 2 refills | Status: DC

## 2015-12-15 NOTE — Patient Instructions (Addendum)

## 2015-12-15 NOTE — Progress Notes (Signed)
   Subjective:    Patient ID: Warnell ForesterJuana M Kervin, female    DOB: 10/18/1975, 40 y.o.   MRN: [id number]  Pt presents to the office today to discuss anxiety. Pt was seen last month and all of her medications were switched to the $4 list. PT was placed phentermine. PT has lost 9 lbs since our last visit. She states she is doing well with her diet, exercising and increasing water intake.  Anxiety  Presents for follow-up visit. Symptoms include excessive worry and nervous/anxious behavior. Patient reports no depressed mood, irritability or panic. Symptoms occur rarely. The quality of sleep is good.        Review of Systems  Constitutional: Negative for irritability.  Psychiatric/Behavioral: The patient is nervous/anxious.   All other systems reviewed and are negative.      Objective:   Physical Exam  Constitutional: She is oriented to person, place, and time. She appears well-developed and well-nourished. No distress.  HENT:  Head: Normocephalic and atraumatic.  Right Ear: External ear normal.  Left Ear: External ear normal.  Nose: Nose normal.  Mouth/Throat: Oropharynx is clear and moist.  Eyes: Pupils are equal, round, and reactive to light.  Neck: Normal range of motion. Neck supple. No thyromegaly present.  Cardiovascular: Normal rate, regular rhythm, normal heart sounds and intact distal pulses.   No murmur heard. Pulmonary/Chest: Effort normal and breath sounds normal. No respiratory distress. She has no wheezes.  Abdominal: Soft. Bowel sounds are normal. She exhibits no distension. There is no tenderness.  Musculoskeletal: Normal range of motion. She exhibits no edema or tenderness.  Neurological: She is alert and oriented to person, place, and time.  Skin: Skin is warm and dry.  Psychiatric: She has a normal mood and affect. Her behavior is normal. Judgment and thought content normal.  Vitals reviewed.     BP 101/75   Pulse 90   Temp 97.5 F (36.4 C) (Oral)   Ht 5\' 3"  (1.6  m)   Wt 138 lb 12.8 oz (63 kg)   BMI 24.59 kg/m      Assessment & Plan:  1. GAD (generalized anxiety disorder)  2. Weight loss counseling, encounter for    Continue all meds Health Maintenance reviewed Diet and exercise encouraged RTO 2 months to evaluate weight loss  Jannifer Rodneyhristy Damir Leung, FNP

## 2016-02-13 ENCOUNTER — Other Ambulatory Visit: Payer: Self-pay | Admitting: Family

## 2016-02-13 DIAGNOSIS — Z713 Dietary counseling and surveillance: Secondary | ICD-10-CM

## 2016-02-14 NOTE — Telephone Encounter (Signed)
rx called into pharmacy

## 2016-02-20 ENCOUNTER — Encounter: Payer: Self-pay | Admitting: Family

## 2016-02-20 ENCOUNTER — Ambulatory Visit (INDEPENDENT_AMBULATORY_CARE_PROVIDER_SITE_OTHER): Payer: BLUE CROSS/BLUE SHIELD | Admitting: Family

## 2016-02-20 VITALS — BP 118/72 | HR 94 | Temp 98.1°F | Ht 63.0 in | Wt 135.6 lb

## 2016-02-20 DIAGNOSIS — Z713 Dietary counseling and surveillance: Secondary | ICD-10-CM | POA: Diagnosis not present

## 2016-02-20 DIAGNOSIS — J301 Allergic rhinitis due to pollen: Secondary | ICD-10-CM

## 2016-02-20 DIAGNOSIS — E559 Vitamin D deficiency, unspecified: Secondary | ICD-10-CM

## 2016-02-20 DIAGNOSIS — E785 Hyperlipidemia, unspecified: Secondary | ICD-10-CM

## 2016-02-20 DIAGNOSIS — K219 Gastro-esophageal reflux disease without esophagitis: Secondary | ICD-10-CM | POA: Diagnosis not present

## 2016-02-20 DIAGNOSIS — F411 Generalized anxiety disorder: Secondary | ICD-10-CM | POA: Diagnosis not present

## 2016-02-20 DIAGNOSIS — Z23 Encounter for immunization: Secondary | ICD-10-CM

## 2016-02-20 MED ORDER — PHENTERMINE HCL 37.5 MG PO TABS: ORAL_TABLET | ORAL | 2 refills | Status: DC

## 2016-02-20 NOTE — Progress Notes (Signed)
   Subjective:    Patient ID: Kathryn Singh, female    DOB: 23-Aug-1975, 40 y.o.   MRN: [id number]  Pt presents to the office today for chronic follow up and to refill weight loss medication. PT has lost 12 lbs since 11/17/15. She states she is exercising and eating healthy.  Anxiety  Presents for follow-up visit. Patient reports no depressed mood, excessive worry, irritability, nervous/anxious behavior or panic. Symptoms occur rarely. The quality of sleep is good.    Gastroesophageal Reflux  She complains of heartburn. She reports no wheezing. This is a chronic problem. The current episode started more than 1 year ago. The problem occurs occasionally. The problem has been waxing and waning. The symptoms are aggravated by caffeine and lying down. She has tried a histamine-2 antagonist for the symptoms. The treatment provided moderate relief.  Hyperlipidemia  This is a chronic problem. The current episode started more than 1 year ago. The problem is controlled. Recent lipid tests were reviewed and are normal. Current antihyperlipidemic treatment includes statins. The current treatment provides moderate improvement of lipids. Risk factors for coronary artery disease include stress and a sedentary lifestyle.      Review of Systems  Constitutional: Negative for irritability.  Respiratory: Negative for wheezing.   Gastrointestinal: Positive for heartburn.  Psychiatric/Behavioral: The patient is not nervous/anxious.   All other systems reviewed and are negative.      Objective:   Physical Exam  Constitutional: She is oriented to person, place, and time. She appears well-developed and well-nourished. No distress.  HENT:  Head: Normocephalic and atraumatic.  Right Ear: External ear normal.  Left Ear: External ear normal.  Nose: Nose normal.  Mouth/Throat: Oropharynx is clear and moist.  Eyes: Pupils are equal, round, and reactive to light.  Neck: Normal range of motion. Neck supple. No  thyromegaly present.  Cardiovascular: Normal rate, regular rhythm, normal heart sounds and intact distal pulses.   No murmur heard. Pulmonary/Chest: Effort normal and breath sounds normal. No respiratory distress. She has no wheezes.  Abdominal: Soft. Bowel sounds are normal. She exhibits no distension. There is no tenderness.  Musculoskeletal: Normal range of motion. She exhibits no edema or tenderness.  Neurological: She is alert and oriented to person, place, and time.  Skin: Skin is warm and dry.  Psychiatric: She has a normal mood and affect. Her behavior is normal. Judgment and thought content normal.  Vitals reviewed.     BP 118/72   Pulse 94   Temp 98.1 F (36.7 C) (Oral)   Ht '5\' 3"'$  (1.6 m)   Wt 135 lb 9.6 oz (61.5 kg)   BMI 24.02 kg/m      Assessment & Plan:  1. Allergic rhinitis due to pollen, unspecified chronicity, unspecified seasonality - CMP14+EGFR  2. Gastroesophageal reflux disease without esophagitis - CMP14+EGFR  3. GAD (generalized anxiety disorder) - CMP14+EGFR  4. Hyperlipidemia, unspecified hyperlipidemia type - CMP14+EGFR  5. Vitamin D deficiency - CMP14+EGFR  6. Weight loss counseling, encounter for - phentermine (ADIPEX-P) 37.5 MG tablet; TAKE ONE TABLET BY MOUTH ONCE DAILY BEFORE BREAKFAST  Dispense: 30 tablet; Refill: 2 - CMP14+EGFR   Continue all meds Labs pending Health Maintenance reviewed Diet and exercise encouraged RTO 3 months for weight loss  Evelina Dun, FNP

## 2016-02-20 NOTE — Addendum Note (Signed)
Addended by: Jannifer RodneyHAWKS, Janiene Aarons A on: 02/20/2016 08:44 AM   Modules accepted: Orders

## 2016-02-20 NOTE — Patient Instructions (Signed)

## 2016-02-21 LAB — CMP14+EGFR
A/G RATIO: 1.6 (ref 1.2–2.2)
ALT: 17 IU/L (ref 0–32)
AST: 14 IU/L (ref 0–40)
Albumin: 4.2 g/dL (ref 3.5–5.5)
Alkaline Phosphatase: 68 IU/L (ref 39–117)
BUN/Creatinine Ratio: 20 (ref 9–23)
BUN: 12 mg/dL (ref 6–24)
Bilirubin Total: 0.4 mg/dL (ref 0.0–1.2)
CALCIUM: 8.9 mg/dL (ref 8.7–10.2)
CO2: 24 mmol/L (ref 18–29)
Chloride: 103 mmol/L (ref 96–106)
Creatinine, Ser: 0.59 mg/dL (ref 0.57–1.00)
GFR, EST AFRICAN AMERICAN: 133 mL/min/{1.73_m2} (ref >59)
GFR, EST NON AFRICAN AMERICAN: 115 mL/min/{1.73_m2} (ref >59)
GLOBULIN, TOTAL: 2.6 g/dL (ref 1.5–4.5)
Glucose: 86 mg/dL (ref 65–99)
POTASSIUM: 3.5 mmol/L (ref 3.5–5.2)
SODIUM: 140 mmol/L (ref 134–144)
TOTAL PROTEIN: 6.8 g/dL (ref 6.0–8.5)

## 2016-03-30 ENCOUNTER — Other Ambulatory Visit: Payer: Self-pay | Admitting: *Deleted

## 2016-03-30 DIAGNOSIS — K219 Gastro-esophageal reflux disease without esophagitis: Secondary | ICD-10-CM

## 2016-03-30 DIAGNOSIS — F411 Generalized anxiety disorder: Secondary | ICD-10-CM

## 2016-03-30 MED ORDER — CITALOPRAM HYDROBROMIDE 40 MG PO TABS: 40.0000 mg | ORAL_TABLET | Freq: Every day | ORAL | 1 refills | Status: DC

## 2016-03-30 MED ORDER — RANITIDINE HCL 150 MG PO TABS
150.0000 mg | ORAL_TABLET | Freq: Two times a day (BID) | ORAL | 3 refills | Status: DC
Start: 2016-03-30 — End: 2016-07-25

## 2016-03-30 NOTE — Progress Notes (Signed)
Pt called requesting 90 day RXs Pt had PE in December [facility]

## 2016-04-10 ENCOUNTER — Other Ambulatory Visit: Payer: Self-pay | Admitting: Family

## 2016-04-10 DIAGNOSIS — Z713 Dietary counseling and surveillance: Secondary | ICD-10-CM

## 2016-04-11 NOTE — Telephone Encounter (Signed)
Patient last seen in office on 02-20-16. Last RF was 12-4 for #30 with 2RFs. Please advise

## 2016-05-24 ENCOUNTER — Encounter: Payer: BLUE CROSS/BLUE SHIELD | Admitting: Family

## 2016-06-25 ENCOUNTER — Encounter: Payer: BLUE CROSS/BLUE SHIELD | Admitting: Family

## 2016-06-26 ENCOUNTER — Encounter: Payer: Self-pay | Admitting: Family

## 2016-07-25 ENCOUNTER — Ambulatory Visit (INDEPENDENT_AMBULATORY_CARE_PROVIDER_SITE_OTHER): Payer: BLUE CROSS/BLUE SHIELD | Admitting: Family

## 2016-07-25 ENCOUNTER — Encounter: Payer: Self-pay | Admitting: Family

## 2016-07-25 VITALS — BP 121/76 | HR 84 | Temp 97.2°F | Ht 63.0 in | Wt 133.4 lb

## 2016-07-25 DIAGNOSIS — K219 Gastro-esophageal reflux disease without esophagitis: Secondary | ICD-10-CM | POA: Diagnosis not present

## 2016-07-25 DIAGNOSIS — Z01419 Encounter for gynecological examination (general) (routine) without abnormal findings: Secondary | ICD-10-CM | POA: Diagnosis not present

## 2016-07-25 DIAGNOSIS — E559 Vitamin D deficiency, unspecified: Secondary | ICD-10-CM

## 2016-07-25 DIAGNOSIS — F411 Generalized anxiety disorder: Secondary | ICD-10-CM

## 2016-07-25 DIAGNOSIS — Z0000 Encounter for general adult medical examination without abnormal findings: Secondary | ICD-10-CM | POA: Diagnosis not present

## 2016-07-25 DIAGNOSIS — Z713 Dietary counseling and surveillance: Secondary | ICD-10-CM

## 2016-07-25 DIAGNOSIS — E785 Hyperlipidemia, unspecified: Secondary | ICD-10-CM | POA: Diagnosis not present

## 2016-07-25 MED ORDER — RANITIDINE HCL 150 MG PO TABS: 150.0000 mg | ORAL_TABLET | Freq: Two times a day (BID) | ORAL | 3 refills | Status: DC

## 2016-07-25 MED ORDER — PHENTERMINE HCL 37.5 MG PO TABS: 37.5000 mg | ORAL_TABLET | Freq: Every day | ORAL | 2 refills | Status: DC

## 2016-07-25 MED ORDER — LOVASTATIN 20 MG PO TABS: 20.0000 mg | ORAL_TABLET | Freq: Every day | ORAL | 3 refills | Status: DC

## 2016-07-25 MED ORDER — CITALOPRAM HYDROBROMIDE 40 MG PO TABS: 40.0000 mg | ORAL_TABLET | Freq: Every day | ORAL | 1 refills | Status: DC

## 2016-07-25 NOTE — Progress Notes (Signed)
Subjective:    Patient ID: Kathryn Singh, female    DOB: [birth year], 41 y.o.   MRN: [id number]  Pt presents to the office today for CPE with pap. Pt has been several months without her medications related to cost.  Gynecologic Exam  The patient's pertinent negatives include no genital lesions or vaginal discharge. This is a chronic problem.  Hyperlipidemia  This is a chronic problem. The current episode started more than 1 year ago. The problem is uncontrolled (triglycerdies). Recent lipid tests were reviewed and are normal. She is currently on no antihyperlipidemic treatment. The current treatment provides no improvement of lipids. Compliance problems include adherence to exercise.  Risk factors for coronary artery disease include dyslipidemia, hypertension, a sedentary lifestyle and family history.  Gastroesophageal Reflux  She complains of belching and heartburn. The current episode started more than 1 year ago. The problem occurs frequently. The symptoms are aggravated by certain foods. She has tried an antacid for the symptoms. The treatment provided mild relief.  Anxiety  Presents for follow-up visit. Symptoms include excessive worry and nervous/anxious behavior. Patient reports no decreased concentration. Symptoms occur occasionally.        Review of Systems  Gastrointestinal: Positive for heartburn.  Genitourinary: Negative for vaginal discharge.  Psychiatric/Behavioral: Negative for decreased concentration. The patient is nervous/anxious.   All other systems reviewed and are negative.      Objective:   Physical Exam  Constitutional: She is oriented to person, place, and time. She appears well-developed and well-nourished. No distress.  HENT:  Head: Normocephalic and atraumatic.  Right Ear: External ear normal.  Left Ear: External ear normal.  Nose: Nose normal.  Mouth/Throat: Oropharynx is clear and moist.  Eyes: Pupils are equal, round, and reactive to light.  Neck:  Normal range of motion. Neck supple. No thyromegaly present.  Cardiovascular: Normal rate, regular rhythm, normal heart sounds and intact distal pulses.   No murmur heard. Pulmonary/Chest: Effort normal and breath sounds normal. No respiratory distress. She has no wheezes. Right breast exhibits no inverted nipple, no mass, no nipple discharge, no skin change and no tenderness. Left breast exhibits no inverted nipple, no mass, no nipple discharge, no skin change and no tenderness. Breasts are symmetrical.  Abdominal: Soft. Bowel sounds are normal. She exhibits no distension. There is no tenderness.  Genitourinary: Vagina normal.  Genitourinary Comments: Bimanual exam- no adnexal masses or tenderness, ovaries nonpalpable   Cervix parous and pink- No discharge   Musculoskeletal: Normal range of motion. She exhibits no edema or tenderness.  Neurological: She is alert and oriented to person, place, and time. She has normal reflexes. No cranial nerve deficit.  Skin: Skin is warm and dry.  Psychiatric: She has a normal mood and affect. Her behavior is normal. Judgment and thought content normal.  Vitals reviewed.     BP 121/76   Pulse 84   Temp 97.2 F (36.2 C) (Oral)   Ht '5\' 3"'  (1.6 m)   Wt 133 lb 6.4 oz (60.5 kg)   BMI 23.63 kg/m      Assessment & Plan:  1. Annual physical exam - citalopram (CELEXA) 40 MG tablet; Take 1 tablet (40 mg total) by mouth daily.  Dispense: 90 tablet; Refill: 1 - ranitidine (ZANTAC) 150 MG tablet; Take 1 tablet (150 mg total) by mouth 2 (two) times daily.  Dispense: 180 tablet; Refill: 3 - lovastatin (MEVACOR) 20 MG tablet; Take 1 tablet (20 mg total) by mouth at bedtime.  Dispense: 90  tablet; Refill: 3 - CMP14+EGFR - Lipid panel - CBC with Differential/Platelet - Thyroid Panel With TSH - VITAMIN D 25 Hydroxy (Vit-D Deficiency, Fractures) - Pap IG w/ reflex to HPV when ASC-U  2. Gastroesophageal reflux disease without esophagitis - ranitidine (ZANTAC)  150 MG tablet; Take 1 tablet (150 mg total) by mouth 2 (two) times daily.  Dispense: 180 tablet; Refill: 3 - CMP14+EGFR  3. Hyperlipidemia, unspecified hyperlipidemia type - lovastatin (MEVACOR) 20 MG tablet; Take 1 tablet (20 mg total) by mouth at bedtime.  Dispense: 90 tablet; Refill: 3 - CMP14+EGFR - Lipid panel  4. GAD (generalized anxiety disorder) - citalopram (CELEXA) 40 MG tablet; Take 1 tablet (40 mg total) by mouth daily.  Dispense: 90 tablet; Refill: 1 - CMP14+EGFR  5. Vitamin D deficiency - CMP14+EGFR - VITAMIN D 25 Hydroxy (Vit-D Deficiency, Fractures)  6. Encounter for gynecological examination without abnormal finding - CMP14+EGFR - CBC with Differential/Platelet - Pap IG w/ reflex to HPV when ASC-U   All medications reordered today. Pt has been 2-3 months without medications.  Labs pending Health Maintenance reviewed Diet and exercise encouraged RTO 6 months   Evelina Dun, FNP

## 2016-07-25 NOTE — Patient Instructions (Signed)
Health Maintenance, Female Adopting a healthy lifestyle and getting preventive care can go a long way to promote health and wellness. Talk with your health care provider about what schedule of regular examinations is right for you. This is a good chance for you to check in with your provider about disease prevention and staying healthy. In between checkups, there are plenty of things you can do on your own. Experts have done a lot of research about which lifestyle changes and preventive measures are most likely to keep you healthy. Ask your health care provider for more information. Weight and diet Eat a healthy diet  Be sure to include plenty of vegetables, fruits, low-fat dairy products, and lean protein.  Do not eat a lot of foods high in solid fats, added sugars, or salt.  Get regular exercise. This is one of the most important things you can do for your health.  Most adults should exercise for at least 150 minutes each week. The exercise should increase your heart rate and make you sweat (moderate-intensity exercise).  Most adults should also do strengthening exercises at least twice a week. This is in addition to the moderate-intensity exercise. Maintain a healthy weight  Body mass index (BMI) is a measurement that can be used to identify possible weight problems. It estimates body fat based on height and weight. Your health care provider can help determine your BMI and help you achieve or maintain a healthy weight.  For females 76 years of age and older:  A BMI below 18.5 is considered underweight.  A BMI of 18.5 to 24.9 is normal.  A BMI of 25 to 29.9 is considered overweight.  A BMI of 30 and above is considered obese. Watch levels of cholesterol and blood lipids  You should start having your blood tested for lipids and cholesterol at 41 years of age, then have this test every 5 years.  You may need to have your cholesterol levels checked more often if:  Your lipid or  cholesterol levels are high.  You are older than 41 years of age.  You are at high risk for heart disease. Cancer screening Lung Cancer  Lung cancer screening is recommended for adults 64-42 years old who are at high risk for lung cancer because of a history of smoking.  A yearly low-dose CT scan of the lungs is recommended for people who:  Currently smoke.  Have quit within the past 15 years.  Have at least a 30-pack-year history of smoking. A pack year is smoking an average of one pack of cigarettes a day for 1 year.  Yearly screening should continue until it has been 15 years since you quit.  Yearly screening should stop if you develop a health problem that would prevent you from having lung cancer treatment. Breast Cancer  Practice breast self-awareness. This means understanding how your breasts normally appear and feel.  It also means doing regular breast self-exams. Let your health care provider know about any changes, no matter how small.  If you are in your 20s or 30s, you should have a clinical breast exam (CBE) by a health care provider every 1-3 years as part of a regular health exam.  If you are 34 or older, have a CBE every year. Also consider having a breast X-ray (mammogram) every year.  If you have a family history of breast cancer, talk to your health care provider about genetic screening.  If you are at high risk for breast cancer, talk  to your health care provider about having an MRI and a mammogram every year.  Breast cancer gene (BRCA) assessment is recommended for women who have family members with BRCA-related cancers. BRCA-related cancers include:  Breast.  Ovarian.  Tubal.  Peritoneal cancers.  Results of the assessment will determine the need for genetic counseling and BRCA1 and BRCA2 testing. Cervical Cancer  Your health care provider may recommend that you be screened regularly for cancer of the pelvic organs (ovaries, uterus, and vagina).  This screening involves a pelvic examination, including checking for microscopic changes to the surface of your cervix (Pap test). You may be encouraged to have this screening done every 3 years, beginning at age 24.  For women ages 66-65, health care providers may recommend pelvic exams and Pap testing every 3 years, or they may recommend the Pap and pelvic exam, combined with testing for human papilloma virus (HPV), every 5 years. Some types of HPV increase your risk of cervical cancer. Testing for HPV may also be done on women of any age with unclear Pap test results.  Other health care providers may not recommend any screening for nonpregnant women who are considered low risk for pelvic cancer and who do not have symptoms. Ask your health care provider if a screening pelvic exam is right for you.  If you have had past treatment for cervical cancer or a condition that could lead to cancer, you need Pap tests and screening for cancer for at least 20 years after your treatment. If Pap tests have been discontinued, your risk factors (such as having a new sexual partner) need to be reassessed to determine if screening should resume. Some women have medical problems that increase the chance of getting cervical cancer. In these cases, your health care provider may recommend more frequent screening and Pap tests. Colorectal Cancer  This type of cancer can be detected and often prevented.  Routine colorectal cancer screening usually begins at 41 years of age and continues through 41 years of age.  Your health care provider may recommend screening at an earlier age if you have risk factors for colon cancer.  Your health care provider may also recommend using home test kits to check for hidden blood in the stool.  A small camera at the end of a tube can be used to examine your colon directly (sigmoidoscopy or colonoscopy). This is done to check for the earliest forms of colorectal cancer.  Routine  screening usually begins at age 41.  Direct examination of the colon should be repeated every 5-10 years through 41 years of age. However, you may need to be screened more often if early forms of precancerous polyps or small growths are found. Skin Cancer  Check your skin from head to toe regularly.  Tell your health care provider about any new moles or changes in moles, especially if there is a change in a mole's shape or color.  Also tell your health care provider if you have a mole that is larger than the size of a pencil eraser.  Always use sunscreen. Apply sunscreen liberally and repeatedly throughout the day.  Protect yourself by wearing long sleeves, pants, a wide-brimmed hat, and sunglasses whenever you are outside. Heart disease, diabetes, and high blood pressure  High blood pressure causes heart disease and increases the risk of stroke. High blood pressure is more likely to develop in:  People who have blood pressure in the high end of the normal range (130-139/85-89 mm Hg).  People who are overweight or obese.  People who are African American.  If you are 59-24 years of age, have your blood pressure checked every 3-5 years. If you are 34 years of age or older, have your blood pressure checked every year. You should have your blood pressure measured twice-once when you are at a hospital or clinic, and once when you are not at a hospital or clinic. Record the average of the two measurements. To check your blood pressure when you are not at a hospital or clinic, you can use:  An automated blood pressure machine at a pharmacy.  A home blood pressure monitor.  If you are between 29 years and 60 years old, ask your health care provider if you should take aspirin to prevent strokes.  Have regular diabetes screenings. This involves taking a blood sample to check your fasting blood sugar level.  If you are at a normal weight and have a low risk for diabetes, have this test once  every three years after 41 years of age.  If you are overweight and have a high risk for diabetes, consider being tested at a younger age or more often. Preventing infection Hepatitis B  If you have a higher risk for hepatitis B, you should be screened for this virus. You are considered at high risk for hepatitis B if:  You were born in a country where hepatitis B is common. Ask your health care provider which countries are considered high risk.  Your parents were born in a high-risk country, and you have not been immunized against hepatitis B (hepatitis B vaccine).  You have HIV or AIDS.  You use needles to inject street drugs.  You live with someone who has hepatitis B.  You have had sex with someone who has hepatitis B.  You get hemodialysis treatment.  You take certain medicines for conditions, including cancer, organ transplantation, and autoimmune conditions. Hepatitis C  Blood testing is recommended for:  Everyone born from [birth year] through 1965.  Anyone with known risk factors for hepatitis C. Sexually transmitted infections (STIs)  You should be screened for sexually transmitted infections (STIs) including gonorrhea and chlamydia if:  You are sexually active and are younger than 41 years of age.  You are older than 41 years of age and your health care provider tells you that you are at risk for this type of infection.  Your sexual activity has changed since you were last screened and you are at an increased risk for chlamydia or gonorrhea. Ask your health care provider if you are at risk.  If you do not have HIV, but are at risk, it may be recommended that you take a prescription medicine daily to prevent HIV infection. This is called pre-exposure prophylaxis (PrEP). You are considered at risk if:  You are sexually active and do not regularly use condoms or know the HIV status of your partner(s).  You take drugs by injection.  You are sexually active with a partner  who has HIV. Talk with your health care provider about whether you are at high risk of being infected with HIV. If you choose to begin PrEP, you should first be tested for HIV. You should then be tested every 3 months for as long as you are taking PrEP. Pregnancy  If you are premenopausal and you may become pregnant, ask your health care provider about preconception counseling.  If you may become pregnant, take 400 to 800 micrograms (mcg) of folic acid  every day.  If you want to prevent pregnancy, talk to your health care provider about birth control (contraception). Osteoporosis and menopause  Osteoporosis is a disease in which the bones lose minerals and strength with aging. This can result in serious bone fractures. Your risk for osteoporosis can be identified using a bone density scan.  If you are 4 years of age or older, or if you are at risk for osteoporosis and fractures, ask your health care provider if you should be screened.  Ask your health care provider whether you should take a calcium or vitamin D supplement to lower your risk for osteoporosis.  Menopause may have certain physical symptoms and risks.  Hormone replacement therapy may reduce some of these symptoms and risks. Talk to your health care provider about whether hormone replacement therapy is right for you. Follow these instructions at home:  Schedule regular health, dental, and eye exams.  Stay current with your immunizations.  Do not use any tobacco products including cigarettes, chewing tobacco, or electronic cigarettes.  If you are pregnant, do not drink alcohol.  If you are breastfeeding, limit how much and how often you drink alcohol.  Limit alcohol intake to no more than 1 drink per day for nonpregnant women. One drink equals 12 ounces of beer, 5 ounces of wine, or 1 ounces of hard liquor.  Do not use street drugs.  Do not share needles.  Ask your health care provider for help if you need support  or information about quitting drugs.  Tell your health care provider if you often feel depressed.  Tell your health care provider if you have ever been abused or do not feel safe at home. This information is not intended to replace advice given to you by your health care provider. Make sure you discuss any questions you have with your health care provider. Document Released: 09/18/2010 Document Revised: 08/11/2015 Document Reviewed: 12/07/2014 Elsevier Interactive Patient Education  2017 [employer].

## 2016-07-25 NOTE — Addendum Note (Signed)
Addended by: Jannifer RodneyHAWKS, Saulo Anthis A on: 07/25/2016 03:45 PM   Modules accepted: Orders

## 2016-07-26 LAB — CMP14+EGFR
A/G RATIO: 1.4 (ref 1.2–2.2)
ALT: 17 IU/L (ref 0–32)
AST: 18 IU/L (ref 0–40)
Albumin: 4.3 g/dL (ref 3.5–5.5)
Alkaline Phosphatase: 69 IU/L (ref 39–117)
BILIRUBIN TOTAL: 0.4 mg/dL (ref 0.0–1.2)
BUN/Creatinine Ratio: 20 (ref 9–23)
BUN: 12 mg/dL (ref 6–24)
CALCIUM: 9.2 mg/dL (ref 8.7–10.2)
CO2: 24 mmol/L (ref 18–29)
Chloride: 101 mmol/L (ref 96–106)
Creatinine, Ser: 0.61 mg/dL (ref 0.57–1.00)
GFR calc Af Amer: 130 mL/min/{1.73_m2} (ref >59)
GFR, EST NON AFRICAN AMERICAN: 113 mL/min/{1.73_m2} (ref >59)
GLOBULIN, TOTAL: 3 g/dL (ref 1.5–4.5)
Glucose: 81 mg/dL (ref 65–99)
POTASSIUM: 4 mmol/L (ref 3.5–5.2)
SODIUM: 140 mmol/L (ref 134–144)
Total Protein: 7.3 g/dL (ref 6.0–8.5)

## 2016-07-26 LAB — THYROID PANEL WITH TSH
FREE THYROXINE INDEX: 2.2 (ref 1.2–4.9)
T3 Uptake Ratio: 25 % (ref 24–39)
T4, Total: 8.8 ug/dL (ref 4.5–12.0)
TSH: 1.62 u[IU]/mL (ref 0.450–4.500)

## 2016-07-26 LAB — LIPID PANEL
Chol/HDL Ratio: 4.4 ratio (ref 0.0–4.4)
Cholesterol, Total: 209 mg/dL — ABNORMAL HIGH (ref 100–199)
HDL: 48 mg/dL (ref >39)
LDL Calculated: 130 mg/dL — ABNORMAL HIGH (ref 0–99)
TRIGLYCERIDES: 153 mg/dL — AB (ref 0–149)
VLDL Cholesterol Cal: 31 mg/dL (ref 5–40)

## 2016-07-26 LAB — CBC WITH DIFFERENTIAL/PLATELET
BASOS ABS: 0 10*3/uL (ref 0.0–0.2)
BASOS: 0 %
EOS (ABSOLUTE): 0.2 10*3/uL (ref 0.0–0.4)
Eos: 3 %
HEMOGLOBIN: 10.8 g/dL — AB (ref 11.1–15.9)
Hematocrit: 34 % (ref 34.0–46.6)
IMMATURE GRANS (ABS): 0 10*3/uL (ref 0.0–0.1)
IMMATURE GRANULOCYTES: 0 %
LYMPHS: 29 %
Lymphocytes Absolute: 2.2 10*3/uL (ref 0.7–3.1)
MCH: 26.9 pg (ref 26.6–33.0)
MCHC: 31.8 g/dL (ref 31.5–35.7)
MCV: 85 fL (ref 79–97)
MONOCYTES: 9 %
Monocytes Absolute: 0.7 10*3/uL (ref 0.1–0.9)
NEUTROS ABS: 4.6 10*3/uL (ref 1.4–7.0)
NEUTROS PCT: 59 %
PLATELETS: 365 10*3/uL (ref 150–379)
RBC: 4.02 x10E6/uL (ref 3.77–5.28)
RDW: 15.5 % — ABNORMAL HIGH (ref 12.3–15.4)
WBC: 7.7 10*3/uL (ref 3.4–10.8)

## 2016-07-26 LAB — VITAMIN D 25 HYDROXY (VIT D DEFICIENCY, FRACTURES): VIT D 25 HYDROXY: 26.9 ng/mL — AB (ref 30.0–100.0)

## 2016-07-30 ENCOUNTER — Other Ambulatory Visit: Payer: Self-pay | Admitting: Family

## 2016-07-30 LAB — PAP IG W/ RFLX HPV ASCU: PAP SMEAR COMMENT: 0

## 2016-09-10 ENCOUNTER — Ambulatory Visit (INDEPENDENT_AMBULATORY_CARE_PROVIDER_SITE_OTHER): Payer: BLUE CROSS/BLUE SHIELD | Admitting: Family Medicine

## 2016-09-10 ENCOUNTER — Ambulatory Visit (INDEPENDENT_AMBULATORY_CARE_PROVIDER_SITE_OTHER): Payer: BLUE CROSS/BLUE SHIELD

## 2016-09-10 ENCOUNTER — Encounter: Payer: Self-pay | Admitting: Family Medicine

## 2016-09-10 VITALS — BP 104/69 | HR 103 | Temp 97.4°F | Ht 63.0 in | Wt 128.8 lb

## 2016-09-10 DIAGNOSIS — M25562 Pain in left knee: Secondary | ICD-10-CM | POA: Diagnosis not present

## 2016-09-10 MED ORDER — NAPROXEN 500 MG PO TABS: 500.0000 mg | ORAL_TABLET | Freq: Two times a day (BID) | ORAL | 0 refills | Status: DC

## 2016-09-10 NOTE — Patient Instructions (Addendum)
Great to meet you!  We are working on a referral to a sports medicine doctor.   Take naproxen 1 pill twice daily for 2 weeks Use compression, use ice 15 minutes 4-6 times daily

## 2016-09-10 NOTE — Progress Notes (Signed)
   HPI  Patient presents today here with left knee pain.  Explains that 5 or 6 days ago she hit her knee in the anterior aspect, just below the patella, a metal bar at work. She works 12-15 hour days on a concrete floor. She states that she wore a knee brace the following day with no, she continued to work. Over the weekend she developed severe left knee pain in the anterior medial area. 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  She denies any severe swelling. She is able to walk and does feel stable on her feet.  PMH: Smoking status noted ROS: Per HPI  Objective: BP 104/69   Pulse (!) 103   Temp 97.4 F (36.3 C) (Oral)   Ht 5\' 3"  (1.6 m)   Wt 128 lb 12.8 oz (58.4 kg)   BMI 22.82 kg/m  Gen: NAD, alert, cooperative with exam HEENT: NCAT, EOMI, PERRL CV: RRR, good S1/S2, no murmur Resp: CTABL, no wheezes, non-labored Ext: No edema, warm Neuro: Alert and oriented, No gross deficits MSK: L knee without erythema, effusion, bruising, or gross deformity + Medial joint line tenderness.  ligamentously intact to Lachman's and with varus and valgus stress.  Positive McMurray's test   Assessment and plan:  # Acute left knee pain Medial left knee pain after seemingly minor injury. Her pain it is out of proportion to her exam. Plain film does not show any bony abnormality.- On my personal review Given severe pain and long work hours I have written her a note for work, recommended scheduled NSAIDs, continued compression, and scheduled ice for the next 2-3 days. Urgent sports medicine referral for more diagnostic certainty, I believe the most likely etiology is a medial meniscal injury., However this is difficult to say given her exam today.     Orders  Placed This Encounter  Procedures  . DG Knee 1-2 Views Left    Standing Status:   Future    Number of Occurrences:   1    Standing Expiration Date:   11/10/2017    Order Specific Question:   Reason for Exam (SYMPTOM  OR DIAGNOSIS REQUIRED)    Answer:   knee pain    Order Specific Question:   Is the patient pregnant?    Answer:   No    Order Specific Question:   Preferred imaging location?    Answer:   Internal  . Ambulatory referral to Sports Medicine    Referral Priority:   Urgent    Referral Type:   Consultation    Number of Visits Requested:   1    Meds ordered this encounter  Medications  . naproxen (NAPROSYN) 500 MG tablet    Sig: Take 1 tablet (500 mg total) by mouth 2 (two) times daily with a meal.    Dispense:  30 tablet    Refill:  0    Murtis SinkSam Arhianna Ebey, MD [facility] 09/10/2016, 8:51 AM

## 2016-09-11 ENCOUNTER — Telehealth: Payer: Self-pay | Admitting: Family Medicine

## 2016-09-11 NOTE — Telephone Encounter (Signed)
I am ok with that. However I would prefer that she wait for her evaluation with sports medicine tomorrow.   Murtis SinkSam Mieko Kneebone, MD [facility] 09/11/2016, 12:15 PM

## 2016-09-11 NOTE — Telephone Encounter (Signed)
Patient aware of recommendation but states that she really needs to get back to work. I have wrote note for patient and placed up front.

## 2016-09-12 ENCOUNTER — Ambulatory Visit (INDEPENDENT_AMBULATORY_CARE_PROVIDER_SITE_OTHER): Payer: BLUE CROSS/BLUE SHIELD | Admitting: Sports Medicine

## 2016-09-12 ENCOUNTER — Encounter: Payer: Self-pay | Admitting: Sports Medicine

## 2016-09-12 ENCOUNTER — Ambulatory Visit: Payer: Self-pay

## 2016-09-12 VITALS — BP 104/78 | HR 90 | Ht 63.0 in | Wt 128.4 lb

## 2016-09-12 DIAGNOSIS — M25562 Pain in left knee: Secondary | ICD-10-CM | POA: Diagnosis not present

## 2016-09-12 NOTE — Progress Notes (Signed)
OFFICE VISIT NOTE Kathryn Singh. Kathryn Singh Sports Medicine [facility] [phone]  Kathryn Singh - 41 y.o. female MRN [id number]  Date of birth: [birth year]  Visit Date: 09/12/2016  PCP: Kathryn Spencer, FNP   Referred by: Kathryn Spencer, FNP  Kathryn Singh, CMA acting as scribe for Dr. Berline Singh.  SUBJECTIVE:   Chief Complaint  Patient presents with  . left knee pain   HPI: As below and per problem based documentation when appropriate.  Pt presents today with complaint of pain in the left knee.  She had anterior knee injury 1-2 weeks about. She hit the area just below the patella on a metal bar. She also had a fall 3 years ago and injured the left knee at that time. She reports that her pain is at the knee cap but also on the medical aspect of the knee. She did have some swelling which has improved since since the incident.  Dr. Ermalinda Singh recommended taking Naproxen, icing the knee, and using compression. She had xray done 09/10/16. Pt reports that she works 15 hours shifts on concrete floor which causes her to have knee pain.   The pain is described as tender to palpation and is rated as 4/10 currently but 10/10 when the injury occurred. Pain is worse when straightening the leg vs bending.   Worsened with bending the knee, bearing weight, going up and down stairs, coming down stairs seems to be worse than going up.  Improves with [facility], compression, rest, Naproxen.  Other associated symptoms include: She has noticed some "sand" or "crushing" sounds in the right knee since she has been using her right leg more d/t the injury.   Pt denies fever, chills.    Review of Systems  Constitutional: Negative for chills and fever.  Respiratory: Negative for shortness of breath and wheezing.   Cardiovascular: Positive for leg swelling. Negative for chest pain and palpitations.  Musculoskeletal: Positive for falls and joint pain.  Neurological: Positive  for tingling (bilateral feet) and headaches (Sunday and Monday). Negative for dizziness.  Endo/Heme/Allergies: Positive for environmental allergies. Does not bruise/bleed easily.    Otherwise per HPI.  HISTORY & PERTINENT PRIOR DATA:  No specialty comments available. She reports that she has never smoked. She has never used smokeless tobacco. No results for input(s): HGBA1C, LABURIC in the last 8760 hours. Medications & Allergies reviewed per EMR Patient Active Problem List   Diagnosis Date Noted  . Acute pain of left knee 10/06/2016  . GAD (generalized anxiety disorder) 09/21/2014  . Vitamin D deficiency 06/07/2014  . Hyperlipidemia 06/01/2014  . Allergic rhinitis 06/01/2014  . GERD (gastroesophageal reflux disease) 11/03/2013   Past Medical History:  Diagnosis Date  . GERD (gastroesophageal reflux disease)    Family History  Problem Relation Age of Onset  . Diabetes Mother   . Heart attack Father    Past Surgical History:  Procedure Laterality Date  . CESAREAN SECTION     Social History   Occupational History  . Not on file.   Social History Main Topics  . Smoking status: Never Smoker  . Smokeless tobacco: Never Used  . Alcohol use No  . Drug use: No  . Sexual activity: Not on file    OBJECTIVE:  VS:  HT:5\' 3"  (160 cm)   WT:128 lb 6.4 oz (58.2 kg)  BMI:22.8    BP:104/78  HR:90bpm  TEMP: ( )  RESP:99 % EXAM: Findings:  WDWN, NAD, Non-toxic appearing Alert & appropriately interactive Not depressed or anxious appearing No increased work of breathing. Pupils are equal. EOM intact without nystagmus No clubbing or cyanosis of the extremities appreciated No significant rashes/lesions/ulcerations overlying the examined area. DP & PT pulses 2+/4.  No significant pretibial edema. Sensation intact to light touch in lower extremities.  Left knee: Overall well aligned.  No significant deformity.  She has mild pain with McMurray's testing.  Generalized synovitis but  no significant effusion.  Ligamentously stable.  Extensor mechanism is intact.     Dg Knee 1-2 Views Left  Result Date: 09/10/2016 CLINICAL DATA:  41 year old with recent onset of left knee pain. No known injuries. EXAM: LEFT KNEE - 1-2 VIEW COMPARISON:  None. FINDINGS: No evidence of acute, subacute or healed fracture or dislocation. Well-preserved joint spaces. Well-preserved bone mineral density. No intrinsic osseous abnormality. No visible joint effusion. IMPRESSION: Normal examination. Electronically Signed   By: Kathryn Saashomas  Singh M.D.   On: 09/10/2016 08:32    ASSESSMENT & PLAN:  Kathryn PieriniJuana was seen today for left knee pain.  Diagnoses and all orders for this visit:  Acute pain of left knee -     US LIMITED JOINT SPACE STRUCTURES LOW LEFT(NO LINKED CHARGES)   ================================================================= Acute pain of left knee Symptoms are consistent with a degenerative medial meniscal tear.  Small amount of synovitis without significant effusion.  Injection today.  Therapeutic exercises including VMO strengthening and hip abduction strengthening reviewed.  Follow-up in 6 weeks for clinical reevaluation advancement of exercises.  Avoid exacerbating activities.  ================================================================= Follow-up: Return in about 6 weeks (around 10/24/2016).   CMA/ATC served as Neurosurgeonscribe during this visit. History, Physical, and Plan performed by medical provider. Documentation and orders reviewed and attested to.      Gaspar BiddingMichael Trenisha Lafavor, DO    [facility] Physician

## 2016-09-12 NOTE — Patient Instructions (Signed)

## 2016-10-06 DIAGNOSIS — M25562 Pain in left knee: Secondary | ICD-10-CM | POA: Insufficient documentation

## 2016-10-06 NOTE — Procedures (Signed)
PROCEDURE NOTE - ULTRASOUND GUIDED NJECTION: Left Knee Images were obtained and interpreted by myself, Gaspar BiddingMichael Rigby, DO  Images have been saved and stored to PACS system. Images obtained on: GE S7 Ultrasound machine  ULTRASOUND FINDINGS:  Patella & Patellar Tendon: Normal Quad & Quad Tendon: Normal Suprapatellar Pouch: Small amount of synovitis, mild effusion Medial Joint Line: Degenerative bulging of the meniscus without overt tearing Lateral Joint Line: Normal lateral meniscus Trochlea: n/a Posterior knee: n/a  DESCRIPTION OF PROCEDURE:  The patient's clinical condition is marked by substantial pain and/or significant functional disability. Other conservative therapy has not provided relief, is contraindicated, or not appropriate. There is a reasonable likelihood that injection will significantly improve the patient's pain and/or functional impairment. After discussing the risks, benefits and expected outcomes of the injection and all questions were reviewed and answered, the patient wished to undergo the above named procedure. Verbal consent was obtained. The ultrasound was used to identify the target structure and adjacent neurovascular structures. The skin was then prepped in sterile fashion and the target structure was injected under direct visualization using sterile technique as below: PREP: Alcohol, Ethel Chloride APPROACH: Superiolateral, single injection, 21g 2" needle INJECTATE: 3cc 1% lidocaine, 2 cc 40mg  DepoMedrol ASPIRATE: N/A DRESSING: Band-Aid  Post procedural instructions including recommending icing and warning signs for infection were reviewed. This procedure was well tolerated and there were no complications.   IMPRESSION: Succesful US Guided Injection

## 2016-10-06 NOTE — Assessment & Plan Note (Signed)
Symptoms are consistent with a degenerative medial meniscal tear.  Small amount of synovitis without significant effusion.  Injection today.  Therapeutic exercises including VMO strengthening and hip abduction strengthening reviewed.  Follow-up in 6 weeks for clinical reevaluation advancement of exercises.  Avoid exacerbating activities.

## 2016-10-26 ENCOUNTER — Ambulatory Visit: Payer: BLUE CROSS/BLUE SHIELD | Admitting: Sports Medicine

## 2016-11-01 ENCOUNTER — Telehealth: Payer: Self-pay | Admitting: Family

## 2016-11-01 NOTE — Telephone Encounter (Signed)
Call to pt Pt states she will contact pt

## 2016-11-02 ENCOUNTER — Other Ambulatory Visit: Payer: Self-pay | Admitting: Family

## 2016-11-02 DIAGNOSIS — Z713 Dietary counseling and surveillance: Secondary | ICD-10-CM

## 2016-11-02 MED ORDER — PHENTERMINE HCL 37.5 MG PO TABS: ORAL_TABLET | ORAL | 0 refills | Status: DC

## 2016-11-02 NOTE — Telephone Encounter (Signed)
Please call rx in

## 2016-11-05 NOTE — Telephone Encounter (Signed)
Phoned in to wm

## 2016-12-07 ENCOUNTER — Other Ambulatory Visit: Payer: Self-pay | Admitting: Family

## 2016-12-07 DIAGNOSIS — Z713 Dietary counseling and surveillance: Secondary | ICD-10-CM

## 2016-12-14 ENCOUNTER — Other Ambulatory Visit: Payer: Self-pay | Admitting: Family

## 2016-12-14 DIAGNOSIS — Z713 Dietary counseling and surveillance: Secondary | ICD-10-CM

## 2016-12-14 NOTE — Telephone Encounter (Signed)
This was one either prescribed on 12/07/2016 by Neysa Bonito and she does not need a refill yet or I don't see any notes prescribing it since December. Please forward this request to [facility] and she can sort it out when she returns on Monday.

## 2016-12-14 NOTE — Telephone Encounter (Signed)
Last seen 09/10/16  Dr Kathlene November PCP  If approved route to nurse to call into [facility]

## 2016-12-28 ENCOUNTER — Other Ambulatory Visit: Payer: Self-pay | Admitting: Family

## 2016-12-28 DIAGNOSIS — Z713 Dietary counseling and surveillance: Secondary | ICD-10-CM

## 2016-12-31 ENCOUNTER — Encounter: Payer: Self-pay | Admitting: Family

## 2016-12-31 ENCOUNTER — Ambulatory Visit: Payer: BLUE CROSS/BLUE SHIELD | Admitting: Family

## 2017-01-24 ENCOUNTER — Ambulatory Visit (INDEPENDENT_AMBULATORY_CARE_PROVIDER_SITE_OTHER): Payer: BLUE CROSS/BLUE SHIELD | Admitting: Family

## 2017-01-24 ENCOUNTER — Encounter: Payer: Self-pay | Admitting: Family

## 2017-01-24 VITALS — BP 102/66 | HR 84 | Temp 97.1°F | Ht 63.0 in | Wt 136.0 lb

## 2017-01-24 DIAGNOSIS — Z713 Dietary counseling and surveillance: Secondary | ICD-10-CM | POA: Diagnosis not present

## 2017-01-24 DIAGNOSIS — M79672 Pain in left foot: Secondary | ICD-10-CM | POA: Diagnosis not present

## 2017-01-24 DIAGNOSIS — M21612 Bunion of left foot: Secondary | ICD-10-CM

## 2017-01-24 MED ORDER — PHENTERMINE HCL 37.5 MG PO TABS: ORAL_TABLET | ORAL | 2 refills | Status: DC

## 2017-01-24 NOTE — Progress Notes (Signed)
   Subjective:    Patient ID: Kathryn Singh, female    DOB: [birth year], 41 y.o.   MRN: [id number]  HPI Pt presents to the office today with right foot pain that started several months ago. Pt states she has tried OTC cushions for bunions, changing her shoes, and motrin with no relief.   Pt states she has intermittent stabbing pain of 7 out 10 that worse when she is walking.   Pt requesting her phentermine to be refilled today. PT states she had lost 10 pounds while taking it. PT states this helps with her cravings.    Review of Systems  Musculoskeletal: Positive for arthralgias and joint swelling.  All other systems reviewed and are negative.      Objective:   Physical Exam  Constitutional: She is oriented to person, place, and time. She appears well-developed and well-nourished. No distress.  HENT:  Head: Normocephalic.  Eyes: Pupils are equal, round, and reactive to light.  Neck: Normal range of motion. Neck supple. No thyromegaly present.  Cardiovascular: Normal rate, regular rhythm, normal heart sounds and intact distal pulses.  No murmur heard. Pulmonary/Chest: Effort normal and breath sounds normal. No respiratory distress. She has no wheezes.  Abdominal: Soft. Bowel sounds are normal. She exhibits no distension. There is no tenderness.  Musculoskeletal: Normal range of motion. She exhibits tenderness (base of left great toe). She exhibits no edema.  Neurological: She is alert and oriented to person, place, and time.  Skin: Skin is warm and dry.  Psychiatric: She has a normal mood and affect. Her behavior is normal. Judgment and thought content normal.  Vitals reviewed.     BP 102/66   Pulse 84   Temp (!) 97.1 F (36.2 C) (Oral)   Ht _0  (1.6 m)   Wt 136 lb (61.7 kg)   BMI 24.09 kg/m      Assessment & Plan:  1. Bunion of left foot Continue using foot cushion  Wide shoes - Arthritis Panel - CMP14+EGFR - Ambulatory referral to Podiatry  2. Weight loss  counseling, encounter for Follow up in 3 months - CMP14+EGFR - phentermine (ADIPEX-P) 37.5 MG tablet; TAKE 1 TABLET BY MOUTH ONCE DAILY BEFORE BREAKFAST  Dispense: 30 tablet; Refill: 2  3. Left foot pain - Arthritis Panel - CMP14+EGFR - Ambulatory referral to [place] pending  Evelina Dun, FNP

## 2017-01-24 NOTE — Patient Instructions (Signed)

## 2017-01-25 LAB — ARTHRITIS PANEL
BASOS ABS: 0 10*3/uL (ref 0.0–0.2)
Basos: 0 %
EOS (ABSOLUTE): 0.2 10*3/uL (ref 0.0–0.4)
EOS: 3 %
HEMATOCRIT: 32.2 % — AB (ref 34.0–46.6)
Hemoglobin: 10 g/dL — ABNORMAL LOW (ref 11.1–15.9)
IMMATURE GRANULOCYTES: 0 %
Immature Grans (Abs): 0 10*3/uL (ref 0.0–0.1)
LYMPHS ABS: 2.5 10*3/uL (ref 0.7–3.1)
Lymphs: 35 %
MCH: 26 pg — AB (ref 26.6–33.0)
MCHC: 31.1 g/dL — ABNORMAL LOW (ref 31.5–35.7)
MCV: 84 fL (ref 79–97)
Monocytes Absolute: 0.5 10*3/uL (ref 0.1–0.9)
Monocytes: 6 %
NEUTROS PCT: 56 %
Neutrophils Absolute: 4 10*3/uL (ref 1.4–7.0)
PLATELETS: 392 10*3/uL — AB (ref 150–379)
RBC: 3.84 x10E6/uL (ref 3.77–5.28)
RDW: 15.3 % (ref 12.3–15.4)
Sed Rate: 13 mm/hr (ref 0–32)
URIC ACID: 3.7 mg/dL (ref 2.5–7.1)
WBC: 7.2 10*3/uL (ref 3.4–10.8)

## 2017-01-25 LAB — CMP14+EGFR
ALBUMIN: 4.3 g/dL (ref 3.5–5.5)
ALK PHOS: 71 IU/L (ref 39–117)
ALT: 16 IU/L (ref 0–32)
AST: 18 IU/L (ref 0–40)
Albumin/Globulin Ratio: 1.6 (ref 1.2–2.2)
BUN/Creatinine Ratio: 19 (ref 9–23)
BUN: 12 mg/dL (ref 6–24)
Bilirubin Total: 0.3 mg/dL (ref 0.0–1.2)
CALCIUM: 9 mg/dL (ref 8.7–10.2)
CO2: 24 mmol/L (ref 20–29)
CREATININE: 0.62 mg/dL (ref 0.57–1.00)
Chloride: 105 mmol/L (ref 96–106)
GFR calc Af Amer: 130 mL/min/{1.73_m2} (ref >59)
GFR calc non Af Amer: 112 mL/min/{1.73_m2} (ref >59)
GLUCOSE: 93 mg/dL (ref 65–99)
Globulin, Total: 2.7 g/dL (ref 1.5–4.5)
Potassium: 3.7 mmol/L (ref 3.5–5.2)
Sodium: 142 mmol/L (ref 134–144)
Total Protein: 7 g/dL (ref 6.0–8.5)

## 2017-01-31 ENCOUNTER — Other Ambulatory Visit: Payer: Self-pay | Admitting: Family

## 2017-01-31 DIAGNOSIS — Z713 Dietary counseling and surveillance: Secondary | ICD-10-CM

## 2017-01-31 NOTE — Telephone Encounter (Signed)
Last seen 01/24/17  Neysa BonitoChristy  If approved route [facility] nurse [facility]

## 2017-01-31 NOTE — Telephone Encounter (Signed)
PT was given rx for 3 months, not sure why we are refill request? Will you call and just verify she has a 3 month refill on the phentermine? Thanks

## 2017-04-12 ENCOUNTER — Encounter: Payer: Self-pay | Admitting: Family

## 2017-04-12 ENCOUNTER — Ambulatory Visit (INDEPENDENT_AMBULATORY_CARE_PROVIDER_SITE_OTHER): Payer: BLUE CROSS/BLUE SHIELD | Admitting: Family

## 2017-04-12 VITALS — BP 121/75 | HR 99 | Temp 97.5°F | Wt 138.4 lb

## 2017-04-12 DIAGNOSIS — J0110 Acute frontal sinusitis, unspecified: Secondary | ICD-10-CM

## 2017-04-12 MED ORDER — AMOXICILLIN-POT CLAVULANATE 875-125 MG PO TABS: 1.0000 | ORAL_TABLET | Freq: Two times a day (BID) | ORAL | 0 refills | Status: DC

## 2017-04-12 NOTE — Progress Notes (Signed)
   Subjective:    Patient ID: Kathryn Singh, female    DOB: 12/09/1975, 42 y.o.   MRN: [id number]  URI   This is a new problem. The current episode started 1 to 4 weeks ago. The problem has been gradually worsening. Associated symptoms include congestion, coughing, ear pain (left), headaches, rhinorrhea, sinus pain, sneezing and a sore throat. She has tried acetaminophen, increased fluids and decongestant for the symptoms. The treatment provided mild relief.      Review of Systems  HENT: Positive for congestion, ear pain (left), rhinorrhea, sinus pain, sneezing and sore throat.   Respiratory: Positive for cough.   Neurological: Positive for headaches.  All other systems reviewed and are negative.      Objective:   Physical Exam  Constitutional: She is oriented to person, place, and time. She appears well-developed and well-nourished. No distress.  HENT:  Head: Normocephalic and atraumatic.  Right Ear: External ear normal.  Left Ear: External ear normal.  Nose: Mucosal edema and rhinorrhea present. Right sinus exhibits frontal sinus tenderness. Left sinus exhibits frontal sinus tenderness.  Mouth/Throat: Oropharyngeal exudate and posterior oropharyngeal erythema present.  Eyes: Pupils are equal, round, and reactive to light.  Neck: Normal range of motion. Neck supple. No thyromegaly present.  Cardiovascular: Normal rate, regular rhythm, normal heart sounds and intact distal pulses.  No murmur heard. Pulmonary/Chest: Effort normal and breath sounds normal. No respiratory distress. She has no wheezes.  Abdominal: Soft. Bowel sounds are normal. She exhibits no distension. There is no tenderness.  Musculoskeletal: Normal range of motion. She exhibits no edema or tenderness.  Lymphadenopathy:    She has cervical adenopathy.  Neurological: She is alert and oriented to person, place, and time. She has normal reflexes. No cranial nerve deficit.  Skin: Skin is warm and dry.  Psychiatric:  She has a normal mood and affect. Her behavior is normal. Judgment and thought content normal.  Vitals reviewed.     There were no vitals taken for this visit.     Assessment & Plan:  1. Acute frontal sinusitis, recurrence not specified - Take meds as prescribed - Use a cool mist humidifier  -Use saline nose sprays frequently -Force fluids -For any cough or congestion  Use plain Mucinex- regular strength or max strength is fine -For fever or aces or pains- take tylenol or ibuprofen appropriate for age and weight. -Throat lozenges if help -New toothbrush in 3 days - amoxicillin-clavulanate (AUGMENTIN) 875-125 MG tablet; Take 1 tablet by mouth 2 (two) times daily.  Dispense: 14 tablet; Refill: 0   Jannifer Rodneyhristy Kosha Jaquith, FNP

## 2017-04-12 NOTE — Patient Instructions (Signed)

## 2017-04-26 ENCOUNTER — Ambulatory Visit: Payer: BLUE CROSS/BLUE SHIELD | Admitting: Family

## 2017-05-11 ENCOUNTER — Ambulatory Visit: Payer: BLUE CROSS/BLUE SHIELD | Admitting: Physician Assistant

## 2017-05-11 ENCOUNTER — Encounter: Payer: Self-pay | Admitting: Physician Assistant

## 2017-05-11 VITALS — BP 115/85 | HR 91 | Temp 97.3°F | Ht 63.0 in | Wt 142.0 lb

## 2017-05-11 DIAGNOSIS — J309 Allergic rhinitis, unspecified: Secondary | ICD-10-CM | POA: Diagnosis not present

## 2017-05-11 DIAGNOSIS — J301 Allergic rhinitis due to pollen: Secondary | ICD-10-CM

## 2017-05-11 DIAGNOSIS — J029 Acute pharyngitis, unspecified: Secondary | ICD-10-CM | POA: Diagnosis not present

## 2017-05-11 MED ORDER — PREDNISONE 10 MG (21) PO TBPK: ORAL_TABLET | ORAL | 0 refills | Status: DC

## 2017-05-11 MED ORDER — MONTELUKAST SODIUM 10 MG PO TABS: 10.0000 mg | ORAL_TABLET | Freq: Every day | ORAL | 3 refills | Status: DC

## 2017-05-11 MED ORDER — CLINDAMYCIN HCL 300 MG PO CAPS: 300.0000 mg | ORAL_CAPSULE | Freq: Three times a day (TID) | ORAL | 0 refills | Status: DC

## 2017-05-11 NOTE — Progress Notes (Signed)
BP 115/85   Pulse 91   Temp (!) 97.3 F (36.3 C) (Oral)   Ht 5\' 3"  (1.6 m)   Wt 142 lb (64.4 kg)   BMI 25.15 kg/m    Subjective:    Patient ID: Kathryn Singh, female    DOB: 22-Jun-1975, 42 y.o.   MRN: [id number]  HPI: Kathryn Singh is a 42 y.o. female presenting on 05/11/2017 for Sore Throat  She has moved into a house last year that had a lot more allergic contaminants.  She has had issues with allergies in the past but is never been this bad before.  She will currently is getting upper respiratory infections and sinus infections.  She does have one approximately 1 month ago and his having one again now.  She has a lot of swelling and pain in the throat.  Past Medical History:  Diagnosis Date  . GERD (gastroesophageal reflux disease)    Relevant past medical, surgical, family and social history reviewed and updated as indicated. Interim medical history since our last visit reviewed. Allergies and medications reviewed and updated. DATA REVIEWED: CHART IN EPIC  Family History reviewed for pertinent findings.  Review of Systems  Constitutional: Positive for chills and fatigue. Negative for activity change and appetite change.  HENT: Positive for congestion, postnasal drip and sore throat.   Eyes: Negative.   Respiratory: Positive for cough and wheezing.   Cardiovascular: Negative.  Negative for chest pain, palpitations and leg swelling.  Gastrointestinal: Negative.   Genitourinary: Negative.   Musculoskeletal: Negative.   Skin: Negative.   Neurological: Positive for headaches.    Allergies as of 05/11/2017   No Known Allergies     Medication List        Accurate as of 05/11/17  8:32 AM. Always use your most recent med list.          cetirizine 10 MG tablet Commonly known as:  ZYRTEC Take 10 mg by mouth daily.   citalopram 40 MG tablet Commonly known as:  CELEXA Take 1 tablet (40 mg total) by mouth daily.   clindamycin 300 MG capsule Commonly known as:   CLEOCIN Take 1 capsule (300 mg total) by mouth 3 (three) times daily.   fluticasone 50 MCG/ACT nasal spray Commonly known as:  FLONASE Place 2 sprays into both nostrils daily.   lovastatin 20 MG tablet Commonly known as:  MEVACOR Take 1 tablet (20 mg total) by mouth at bedtime.   montelukast 10 MG tablet Commonly known as:  SINGULAIR Take 1 tablet (10 mg total) by mouth at bedtime.   phentermine 37.5 MG tablet Commonly known as:  ADIPEX-P TAKE 1 TABLET BY MOUTH ONCE DAILY BEFORE BREAKFAST   predniSONE 10 MG (21) Tbpk tablet Commonly known as:  STERAPRED UNI-PAK 21 TAB As directed x 6 days   ranitidine 150 MG tablet Commonly known as:  ZANTAC Take 1 tablet (150 mg total) by mouth 2 (two) times daily.          Objective:    BP 115/85   Pulse 91   Temp (!) 97.3 F (36.3 C) (Oral)   Ht 5\' 3"  (1.6 m)   Wt 142 lb (64.4 kg)   BMI 25.15 kg/m   No Known Allergies  Wt Readings from Last 3 Encounters:  05/11/17 142 lb (64.4 kg)  04/12/17 138 lb 6.4 oz (62.8 kg)  01/24/17 136 lb (61.7 kg)    Physical Exam  Constitutional: She is oriented to person, place,  and time. She appears well-developed and well-nourished.  HENT:  Head: Normocephalic and atraumatic.  Right Ear: Tympanic membrane and external ear normal. No middle ear effusion.  Left Ear: Tympanic membrane and external ear normal.  No middle ear effusion.  Nose: Mucosal edema and rhinorrhea present. Right sinus exhibits no maxillary sinus tenderness. Left sinus exhibits no maxillary sinus tenderness.  Mouth/Throat: Uvula is midline. Posterior oropharyngeal erythema present.  Eyes: Conjunctivae and EOM are normal. Pupils are equal, round, and reactive to light. Right eye exhibits no discharge. Left eye exhibits no discharge.  Neck: Normal range of motion.  Cardiovascular: Normal rate, regular rhythm and normal heart sounds.  Pulmonary/Chest: Effort normal and breath sounds normal. No respiratory distress. She has no  wheezes.  Abdominal: Soft.  Lymphadenopathy:    She has no cervical adenopathy.  Neurological: She is alert and oriented to person, place, and time.  Skin: Skin is warm and dry.  Psychiatric: She has a normal mood and affect.    This patient comes for allergy issues.  Since last year she has moved into a new house that she feels has a lot of    Assessment & Plan:   1. Sore throat - Rapid Strep Screen ([facility]) - Culture, Group A Strep - clindamycin (CLEOCIN) 300 MG capsule; Take 1 capsule (300 mg total) by mouth 3 (three) times daily.  Dispense: 30 capsule; Refill: 0 - predniSONE (STERAPRED UNI-PAK 21 TAB) 10 MG (21) TBPK tablet; As directed x 6 days  Dispense: 21 tablet; Refill: 0  2. Allergic rhinitis due to pollen, unspecified seasonality - montelukast (SINGULAIR) 10 MG tablet; Take 1 tablet (10 mg total) by mouth at bedtime.  Dispense: 30 tablet; Refill: 3 - Ambulatory referral to Allergy  3. Chronic allergic rhinitis - Ambulatory referral to Allergy   Continue all other maintenance medications as listed above.  Follow up plan: No Follow-up on file.  Educational handout given for survey  Remus LofflerAngel S. Lyllie Cobbins PA-C [facility] [address]  [place], [place] [address] [phone]   05/11/2017, 8:32 AM

## 2017-05-11 NOTE — Patient Instructions (Signed)
In a few days you may receive a survey in the mail or online from [employer] regarding your visit with us today. Please take a moment to fill this out. Your feedback is very important to our whole office. It can help us better understand your needs as well as improve your experience and satisfaction. Thank you for taking your time to complete it. We care about you.  Hazael Olveda, PA-C  

## 2017-05-13 LAB — CULTURE, GROUP A STREP

## 2017-05-13 LAB — RAPID STREP SCREEN (MED CTR MEBANE ONLY): Strep Gp A Ag, IA W/Reflex: NEGATIVE

## 2017-05-14 ENCOUNTER — Telehealth: Payer: Self-pay | Admitting: Family

## 2017-05-14 LAB — CULTURE, GROUP A STREP

## 2017-05-14 NOTE — Telephone Encounter (Signed)
Patient aware of results.

## 2017-05-14 NOTE — Telephone Encounter (Signed)
Left message with results

## 2017-05-17 ENCOUNTER — Other Ambulatory Visit: Payer: Self-pay | Admitting: Family

## 2017-05-17 DIAGNOSIS — Z713 Dietary counseling and surveillance: Secondary | ICD-10-CM

## 2017-05-17 NOTE — Telephone Encounter (Signed)
Left message if you need this medication , you will need an appointment with your provider.

## 2017-05-17 NOTE — Telephone Encounter (Signed)
Last seen 05/11/17  [facility]

## 2017-05-17 NOTE — Telephone Encounter (Signed)
Patient needs appt

## 2017-05-17 NOTE — Telephone Encounter (Signed)
Last seen 05/10/17  Neysa Bonitohristy PCP

## 2017-05-31 ENCOUNTER — Other Ambulatory Visit: Payer: Self-pay | Admitting: Family

## 2017-05-31 DIAGNOSIS — Z0000 Encounter for general adult medical examination without abnormal findings: Secondary | ICD-10-CM

## 2017-05-31 DIAGNOSIS — Z713 Dietary counseling and surveillance: Secondary | ICD-10-CM

## 2017-05-31 DIAGNOSIS — F411 Generalized anxiety disorder: Secondary | ICD-10-CM

## 2017-05-31 MED ORDER — CITALOPRAM HYDROBROMIDE 40 MG PO TABS
40.0000 mg | ORAL_TABLET | Freq: Every day | ORAL | 0 refills | Status: DC
Start: 2017-05-31 — End: 2017-09-04

## 2017-05-31 NOTE — Telephone Encounter (Signed)
Pt aware refill sent to pharmacy 

## 2017-05-31 NOTE — Telephone Encounter (Signed)
What is the name of the medication? citalopram (CELEXA) 40 MG tablet   Have you contacted your pharmacy to request a refill? yes  [facility]   Patient notified that their request is being sent to the clinical staff for review and that they should receive a call once it is complete. If they do not receive a call within 24 hours they can check with their pharmacy or our office.

## 2017-06-05 ENCOUNTER — Other Ambulatory Visit: Payer: Self-pay | Admitting: Family

## 2017-06-05 DIAGNOSIS — Z713 Dietary counseling and surveillance: Secondary | ICD-10-CM

## 2017-06-10 ENCOUNTER — Other Ambulatory Visit: Payer: Self-pay | Admitting: Family

## 2017-06-10 DIAGNOSIS — Z713 Dietary counseling and surveillance: Secondary | ICD-10-CM

## 2017-06-12 ENCOUNTER — Other Ambulatory Visit: Payer: Self-pay | Admitting: Family

## 2017-06-12 DIAGNOSIS — Z713 Dietary counseling and surveillance: Secondary | ICD-10-CM

## 2017-07-03 ENCOUNTER — Other Ambulatory Visit: Payer: Self-pay | Admitting: Family

## 2017-07-03 DIAGNOSIS — Z713 Dietary counseling and surveillance: Secondary | ICD-10-CM

## 2017-07-10 ENCOUNTER — Ambulatory Visit: Payer: BLUE CROSS/BLUE SHIELD | Admitting: Family

## 2017-07-15 ENCOUNTER — Encounter: Payer: Self-pay | Admitting: Family

## 2017-07-16 ENCOUNTER — Encounter: Payer: Self-pay | Admitting: Family

## 2017-07-16 ENCOUNTER — Ambulatory Visit (INDEPENDENT_AMBULATORY_CARE_PROVIDER_SITE_OTHER): Payer: BLUE CROSS/BLUE SHIELD | Admitting: Family

## 2017-07-16 VITALS — BP 106/69 | HR 87 | Temp 97.2°F | Ht 63.0 in | Wt 150.8 lb

## 2017-07-16 DIAGNOSIS — F411 Generalized anxiety disorder: Secondary | ICD-10-CM | POA: Diagnosis not present

## 2017-07-16 DIAGNOSIS — R232 Flushing: Secondary | ICD-10-CM

## 2017-07-16 DIAGNOSIS — E782 Mixed hyperlipidemia: Secondary | ICD-10-CM | POA: Diagnosis not present

## 2017-07-16 DIAGNOSIS — E559 Vitamin D deficiency, unspecified: Secondary | ICD-10-CM | POA: Diagnosis not present

## 2017-07-16 DIAGNOSIS — K219 Gastro-esophageal reflux disease without esophagitis: Secondary | ICD-10-CM | POA: Diagnosis not present

## 2017-07-16 MED ORDER — VENLAFAXINE HCL ER 75 MG PO CP24: 75.0000 mg | ORAL_CAPSULE | Freq: Every day | ORAL | 1 refills | Status: DC

## 2017-07-16 NOTE — Patient Instructions (Signed)
Menopause Menopause is the normal time of life when menstrual periods stop completely. Menopause is complete when you have missed 12 consecutive menstrual periods. It usually occurs between the ages of 48 years and 55 years. Very rarely does a woman develop menopause before the age of 40 years. At menopause, your ovaries stop producing the female hormones estrogen and progesterone. This can cause undesirable symptoms and also affect your health. Sometimes the symptoms may occur 4-5 years before the menopause begins. There is no relationship between menopause and:  Oral contraceptives.  Number of children you had.  Race.  The age your menstrual periods started (menarche).  Heavy smokers and very thin women may develop menopause earlier in life. What are the causes?  The ovaries stop producing the female hormones estrogen and progesterone. Other causes include:  Surgery to remove both ovaries.  The ovaries stop functioning for no known reason.  Tumors of the pituitary gland in the brain.  Medical disease that affects the ovaries and hormone production.  Radiation treatment to the abdomen or pelvis.  Chemotherapy that affects the ovaries.  What are the signs or symptoms?  Hot flashes.  Night sweats.  Decrease in sex drive.  Vaginal dryness and thinning of the vagina causing painful intercourse.  Dryness of the skin and developing wrinkles.  Headaches.  Tiredness.  Irritability.  Memory problems.  Weight gain.  Bladder infections.  Hair growth of the face and chest.  Infertility. More serious symptoms include:  Loss of bone (osteoporosis) causing breaks (fractures).  Depression.  Hardening and narrowing of the arteries (atherosclerosis) causing heart attacks and strokes.  How is this diagnosed?  When the menstrual periods have stopped for 12 straight months.  Physical exam.  Hormone studies of the blood. How is this treated? There are many treatment  choices and nearly as many questions about them. The decisions to treat or not to treat menopausal changes is an individual choice made with your health care provider. Your health care provider can discuss the treatments with you. Together, you can decide which treatment will work best for you. Your treatment choices may include:  Hormone therapy (estrogen and progesterone).  Non-hormonal medicines.  Treating the individual symptoms with medicine (for example antidepressants for depression).  Herbal medicines that may help specific symptoms.  Counseling by a psychiatrist or psychologist.  Group therapy.  Lifestyle changes including: ? Eating healthy. ? Regular exercise. ? Limiting caffeine and alcohol. ? Stress management and meditation.  No treatment.  Follow these instructions at home:  Take the medicine your health care provider gives you as directed.  Get plenty of sleep and rest.  Exercise regularly.  Eat a diet that contains calcium (good for the bones) and soy products (acts like estrogen hormone).  Avoid alcoholic beverages.  Do not smoke.  If you have hot flashes, dress in layers.  Take supplements, calcium, and vitamin D to strengthen bones.  You can use over-the-counter lubricants or moisturizers for vaginal dryness.  Group therapy is sometimes very helpful.  Acupuncture may be helpful in some cases. Contact a health care provider if:  You are not sure you are in menopause.  You are having menopausal symptoms and need advice and treatment.  You are still having menstrual periods after age 55 years.  You have pain with intercourse.  Menopause is complete (no menstrual period for 12 months) and you develop vaginal bleeding.  You need a referral to a specialist (gynecologist, psychiatrist, or psychologist) for treatment. Get help right   away if:  You have severe depression.  You have excessive vaginal bleeding.  You fell and think you have a  broken bone.  You have pain when you urinate.  You develop leg or chest pain.  You have a fast pounding heart beat (palpitations).  You have severe headaches.  You develop vision problems.  You feel a lump in your breast.  You have abdominal pain or severe indigestion. This information is not intended to replace advice given to you by your health care provider. Make sure you discuss any questions you have with your health care provider. Document Released: 05/26/2003 Document Revised: 08/11/2015 Document Reviewed: 10/02/2012 Elsevier Interactive Patient Education  2017 Elsevier Inc.  

## 2017-07-16 NOTE — Progress Notes (Signed)
Subjective:    Patient ID: Kathryn Singh, female    DOB: 05-24-1975, 42 y.o.   MRN: [id number]  Pt presents to the office today for chronic follow up. Pt states she is having hot flashes that started over the last few months. She states her menstrual cycle is every 28 days and denies any menorrhagia.  Anxiety  Presents for follow-up visit. Symptoms include depressed mood, excessive worry, irritability and nervous/anxious behavior. Patient reports no nausea. Symptoms occur most days. The severity of symptoms is moderate. The quality of sleep is good.    Hyperlipidemia  This is a chronic problem. The current episode started more than 1 year ago. The problem is uncontrolled. Recent lipid tests were reviewed and are high. Current antihyperlipidemic treatment includes statins. The current treatment provides moderate improvement of lipids. Risk factors for coronary artery disease include dyslipidemia and family history.  Gastroesophageal Reflux  She reports no belching, no coughing, no heartburn or no nausea. This is a chronic problem. The current episode started more than 1 year ago. The problem occurs occasionally. The problem has been waxing and waning. She has tried a PPI for the symptoms. The treatment provided mild relief.      Review of Systems  Constitutional: Positive for irritability.  Respiratory: Negative for cough.   Gastrointestinal: Negative for heartburn and nausea.  Psychiatric/Behavioral: The patient is nervous/anxious.   All other systems reviewed and are negative.      Objective:   Physical Exam  Constitutional: She is oriented to person, place, and time. She appears well-developed and well-nourished. No distress.  HENT:  Head: Normocephalic and atraumatic.  Right Ear: External ear normal.  Left Ear: External ear normal.  Nose: Nose normal.  Mouth/Throat: Oropharynx is clear and moist.  Eyes: Pupils are equal, round, and reactive to light.  Neck: Normal range of  motion. Neck supple. No thyromegaly present.  Cardiovascular: Normal rate, regular rhythm, normal heart sounds and intact distal pulses.  No murmur heard. Pulmonary/Chest: Effort normal and breath sounds normal. No respiratory distress. She has no wheezes.  Abdominal: Soft. Bowel sounds are normal. She exhibits no distension. There is no tenderness.  Musculoskeletal: Normal range of motion. She exhibits no edema or tenderness.  Neurological: She is alert and oriented to person, place, and time. She has normal reflexes. No cranial nerve deficit.  Skin: Skin is warm and dry.  Psychiatric: She has a normal mood and affect. Her behavior is normal. Judgment and thought content normal.  Vitals reviewed.    BP 106/69   Pulse 87   Temp (!) 97.2 F (36.2 C) (Oral)   Ht _0  (1.6 m)   Wt 150 lb 12.8 oz (68.4 kg)   BMI 26.71 kg/m      Assessment & Plan:  1. Gastroesophageal reflux disease without esophagitis - CMP14+EGFR  2. GAD (generalized anxiety disorder) Will stop Celexa and start Effexor today to help with increase anxiety and hot flashes Stress management  - CMP14+EGFR - venlafaxine XR (EFFEXOR XR) 75 MG 24 hr capsule; Take 1 capsule (75 mg total) by mouth daily with breakfast.  Dispense: 90 capsule; Refill: 1  3. Vitamin D deficiency - CMP14+EGFR  4. Mixed hyperlipidemia - CMP14+EGFR - Lipid panel  5. Hot flashes - venlafaxine XR (EFFEXOR XR) 75 MG 24 hr capsule; Take 1 capsule (75 mg total) by mouth daily with breakfast.  Dispense: 90 capsule; Refill: 1   Continue all meds Labs pending Health Maintenance reviewed Diet and exercise  encouraged RTO 6 months   Evelina Dun, FNP

## 2017-07-17 LAB — CMP14+EGFR
A/G RATIO: 1.5 (ref 1.2–2.2)
ALT: 20 IU/L (ref 0–32)
AST: 20 IU/L (ref 0–40)
Albumin: 4.3 g/dL (ref 3.5–5.5)
Alkaline Phosphatase: 72 IU/L (ref 39–117)
BILIRUBIN TOTAL: 0.4 mg/dL (ref 0.0–1.2)
BUN/Creatinine Ratio: 22 (ref 9–23)
BUN: 15 mg/dL (ref 6–24)
CALCIUM: 9.1 mg/dL (ref 8.7–10.2)
CHLORIDE: 104 mmol/L (ref 96–106)
CO2: 23 mmol/L (ref 20–29)
Creatinine, Ser: 0.67 mg/dL (ref 0.57–1.00)
GFR calc Af Amer: 125 mL/min/{1.73_m2} (ref >59)
GFR calc non Af Amer: 109 mL/min/{1.73_m2} (ref >59)
GLUCOSE: 82 mg/dL (ref 65–99)
Globulin, Total: 2.9 g/dL (ref 1.5–4.5)
POTASSIUM: 4.1 mmol/L (ref 3.5–5.2)
Sodium: 140 mmol/L (ref 134–144)
Total Protein: 7.2 g/dL (ref 6.0–8.5)

## 2017-07-17 LAB — LIPID PANEL
Chol/HDL Ratio: 4.1 ratio (ref 0.0–4.4)
Cholesterol, Total: 191 mg/dL (ref 100–199)
HDL: 47 mg/dL (ref >39)
LDL Calculated: 113 mg/dL — ABNORMAL HIGH (ref 0–99)
TRIGLYCERIDES: 157 mg/dL — AB (ref 0–149)
VLDL CHOLESTEROL CAL: 31 mg/dL (ref 5–40)

## 2017-07-17 LAB — VITAMIN D 25 HYDROXY (VIT D DEFICIENCY, FRACTURES): Vit D, 25-Hydroxy: 21.2 ng/mL — ABNORMAL LOW (ref 30.0–100.0)

## 2017-07-18 ENCOUNTER — Other Ambulatory Visit: Payer: Self-pay | Admitting: Family

## 2017-07-18 MED ORDER — VITAMIN D (ERGOCALCIFEROL) 1.25 MG (50000 UNIT) PO CAPS: 50000.0000 [IU] | ORAL_CAPSULE | ORAL | 3 refills | Status: DC

## 2017-08-06 ENCOUNTER — Ambulatory Visit: Payer: BLUE CROSS/BLUE SHIELD | Admitting: Allergy & Immunology

## 2017-08-16 ENCOUNTER — Telehealth: Payer: Self-pay | Admitting: Family

## 2017-08-16 ENCOUNTER — Other Ambulatory Visit: Payer: Self-pay | Admitting: *Deleted

## 2017-08-16 DIAGNOSIS — K219 Gastro-esophageal reflux disease without esophagitis: Secondary | ICD-10-CM

## 2017-08-16 DIAGNOSIS — Z0000 Encounter for general adult medical examination without abnormal findings: Secondary | ICD-10-CM

## 2017-08-16 DIAGNOSIS — E785 Hyperlipidemia, unspecified: Secondary | ICD-10-CM

## 2017-08-16 MED ORDER — RANITIDINE HCL 150 MG PO TABS: 150.0000 mg | ORAL_TABLET | Freq: Two times a day (BID) | ORAL | 3 refills | Status: DC

## 2017-08-16 MED ORDER — LOVASTATIN 20 MG PO TABS: 20.0000 mg | ORAL_TABLET | Freq: Every day | ORAL | 3 refills | Status: DC

## 2017-08-16 NOTE — Progress Notes (Signed)
[facility]

## 2017-09-04 ENCOUNTER — Other Ambulatory Visit: Payer: Self-pay | Admitting: Family

## 2017-09-04 DIAGNOSIS — Z0000 Encounter for general adult medical examination without abnormal findings: Secondary | ICD-10-CM

## 2017-09-04 DIAGNOSIS — F411 Generalized anxiety disorder: Secondary | ICD-10-CM

## 2017-09-13 ENCOUNTER — Other Ambulatory Visit: Payer: Self-pay | Admitting: Physician Assistant

## 2017-09-13 DIAGNOSIS — J301 Allergic rhinitis due to pollen: Secondary | ICD-10-CM

## 2017-12-31 ENCOUNTER — Other Ambulatory Visit: Payer: Self-pay | Admitting: Family

## 2017-12-31 DIAGNOSIS — R232 Flushing: Secondary | ICD-10-CM

## 2017-12-31 DIAGNOSIS — F411 Generalized anxiety disorder: Secondary | ICD-10-CM

## 2018-01-24 DIAGNOSIS — M7989 Other specified soft tissue disorders: Secondary | ICD-10-CM | POA: Diagnosis not present

## 2018-02-17 ENCOUNTER — Other Ambulatory Visit: Payer: Self-pay | Admitting: Family

## 2018-02-17 DIAGNOSIS — F411 Generalized anxiety disorder: Secondary | ICD-10-CM

## 2018-02-17 DIAGNOSIS — R232 Flushing: Secondary | ICD-10-CM

## 2018-03-17 ENCOUNTER — Other Ambulatory Visit: Payer: Self-pay | Admitting: Family

## 2018-03-17 DIAGNOSIS — F411 Generalized anxiety disorder: Secondary | ICD-10-CM

## 2018-03-17 DIAGNOSIS — R232 Flushing: Secondary | ICD-10-CM

## 2018-03-17 NOTE — Telephone Encounter (Signed)
[facility] 07/16/17  [facility]

## 2018-04-18 ENCOUNTER — Ambulatory Visit: Payer: BLUE CROSS/BLUE SHIELD

## 2018-06-21 ENCOUNTER — Other Ambulatory Visit: Payer: Self-pay | Admitting: Family

## 2018-06-21 DIAGNOSIS — F411 Generalized anxiety disorder: Secondary | ICD-10-CM

## 2018-06-21 DIAGNOSIS — R232 Flushing: Secondary | ICD-10-CM

## 2018-06-23 NOTE — Telephone Encounter (Signed)
Last office visit 07/16/2017

## 2018-07-23 ENCOUNTER — Other Ambulatory Visit: Payer: Self-pay | Admitting: Family

## 2018-07-23 DIAGNOSIS — Z0000 Encounter for general adult medical examination without abnormal findings: Secondary | ICD-10-CM

## 2018-07-23 DIAGNOSIS — K219 Gastro-esophageal reflux disease without esophagitis: Secondary | ICD-10-CM

## 2018-07-23 MED ORDER — OMEPRAZOLE 20 MG PO CPDR: 20.0000 mg | DELAYED_RELEASE_CAPSULE | Freq: Every day | ORAL | 1 refills | Status: DC

## 2018-07-23 NOTE — Telephone Encounter (Signed)
Pharmacy comment: MED RECALLED/BACK ORDER. PLEASE ADVISE ON MED CHANG

## 2018-07-23 NOTE — Progress Notes (Signed)
Zantac changed to Prilosec. Prescription sent to pharmacy   

## 2018-09-13 ENCOUNTER — Encounter: Payer: Self-pay | Admitting: Family Medicine

## 2018-09-14 ENCOUNTER — Other Ambulatory Visit: Payer: Self-pay | Admitting: Family

## 2018-09-14 DIAGNOSIS — F411 Generalized anxiety disorder: Secondary | ICD-10-CM

## 2018-09-14 DIAGNOSIS — R232 Flushing: Secondary | ICD-10-CM

## 2018-09-30 ENCOUNTER — Ambulatory Visit (INDEPENDENT_AMBULATORY_CARE_PROVIDER_SITE_OTHER): Payer: BC Managed Care – PPO | Admitting: Family

## 2018-09-30 ENCOUNTER — Other Ambulatory Visit: Payer: Self-pay

## 2018-09-30 ENCOUNTER — Encounter: Payer: Self-pay | Admitting: Family

## 2018-09-30 DIAGNOSIS — H1013 Acute atopic conjunctivitis, bilateral: Secondary | ICD-10-CM

## 2018-09-30 MED ORDER — OLOPATADINE HCL 0.2 % OP SOLN: 1.0000 [drp] | Freq: Two times a day (BID) | OPHTHALMIC | 2 refills | Status: DC

## 2018-09-30 NOTE — Progress Notes (Signed)
   Virtual Visit via telephone Note  I connected with Kathryn Singh on 09/30/18 at 3:11 pm by telephone and verified that I am speaking with the correct person using two identifiers. Kathryn Singh is currently located at home and no one is currently with her during visit. The provider, Evelina Dun, FNP is located in their office at time of visit.  I discussed the limitations, risks, security and privacy concerns of performing an evaluation and management service by telephone and the availability of in person appointments. I also discussed with the patient that there may be a patient responsible charge related to this service. The patient expressed understanding and agreed to proceed.   History and Present Illness:  HPI Pt calls the office today with bilateral eye itching that started two weeks ago. She states the eye lids are erythemas. Denies any color discharge or fevers, but states she does have clear discharge at times. She continues her zyrtec and Flonase. She has used Polytrim with mild relief and has used OTC eye drops with mild relief.    Review of Systems  Eyes: Positive for redness.  All other systems reviewed and are negative.    Observations/Objective: No SOB or distress noted  Assessment and Plan: 1. Allergic conjunctivitis of both eyes Continue Zyrtec and flonase daily Start Pataday  Do not rub your eyes Cool compresses as needed Avoid allergens  - Olopatadine HCl (PATADAY) 0.2 % SOLN; Apply 1 drop to eye 2 (two) times a day.  Dispense: 2.5 mL; Refill: 2      I discussed the assessment and treatment plan with the patient. The patient was provided an opportunity to ask questions and all were answered. The patient agreed with the plan and demonstrated an understanding of the instructions.   The patient was advised to call back or seek an in-person evaluation if the symptoms worsen or if the condition fails to improve as anticipated.  The above assessment and  management plan was discussed with the patient. The patient verbalized understanding of and has agreed to the management plan. Patient is aware to call the clinic if symptoms persist or worsen. Patient is aware when to return to the clinic for a follow-up visit. Patient educated on when it is appropriate to go to the emergency department.   Time call ended: 3:23 pm    I provided 12 minutes of non-face-to-face time during this encounter.    Evelina Dun, FNP

## 2018-10-05 DIAGNOSIS — H0100A Unspecified blepharitis right eye, upper and lower eyelids: Secondary | ICD-10-CM | POA: Diagnosis not present

## 2018-10-05 DIAGNOSIS — H0100B Unspecified blepharitis left eye, upper and lower eyelids: Secondary | ICD-10-CM | POA: Diagnosis not present

## 2018-10-16 DIAGNOSIS — H10013 Acute follicular conjunctivitis, bilateral: Secondary | ICD-10-CM | POA: Diagnosis not present

## 2018-10-18 ENCOUNTER — Other Ambulatory Visit: Payer: Self-pay | Admitting: Family

## 2018-10-18 DIAGNOSIS — R232 Flushing: Secondary | ICD-10-CM

## 2018-10-18 DIAGNOSIS — F411 Generalized anxiety disorder: Secondary | ICD-10-CM

## 2018-10-31 ENCOUNTER — Other Ambulatory Visit: Payer: Self-pay | Admitting: Family

## 2018-10-31 DIAGNOSIS — F411 Generalized anxiety disorder: Secondary | ICD-10-CM

## 2018-10-31 DIAGNOSIS — R232 Flushing: Secondary | ICD-10-CM

## 2018-11-04 ENCOUNTER — Encounter: Payer: Self-pay | Admitting: Family

## 2018-11-04 ENCOUNTER — Ambulatory Visit (INDEPENDENT_AMBULATORY_CARE_PROVIDER_SITE_OTHER): Payer: BC Managed Care – PPO | Admitting: Family

## 2018-11-04 DIAGNOSIS — F411 Generalized anxiety disorder: Secondary | ICD-10-CM | POA: Diagnosis not present

## 2018-11-04 DIAGNOSIS — E785 Hyperlipidemia, unspecified: Secondary | ICD-10-CM

## 2018-11-04 DIAGNOSIS — K219 Gastro-esophageal reflux disease without esophagitis: Secondary | ICD-10-CM | POA: Diagnosis not present

## 2018-11-04 DIAGNOSIS — E782 Mixed hyperlipidemia: Secondary | ICD-10-CM

## 2018-11-04 MED ORDER — LOVASTATIN 20 MG PO TABS: 20.0000 mg | ORAL_TABLET | Freq: Every day | ORAL | 3 refills | Status: DC

## 2018-11-04 MED ORDER — OMEPRAZOLE 20 MG PO CPDR: 20.0000 mg | DELAYED_RELEASE_CAPSULE | Freq: Every day | ORAL | 1 refills | Status: DC

## 2018-11-04 MED ORDER — VENLAFAXINE HCL ER 75 MG PO CP24: 75.0000 mg | ORAL_CAPSULE | Freq: Every day | ORAL | 1 refills | Status: DC

## 2018-11-04 MED ORDER — VENLAFAXINE HCL ER 37.5 MG PO CP24: ORAL_CAPSULE | ORAL | 0 refills | Status: DC

## 2018-11-04 NOTE — Progress Notes (Signed)
Virtual Visit via telephone Note Due to COVID-19 pandemic this visit was conducted virtually. This visit type was conducted due to national recommendations for restrictions regarding the COVID-19 Pandemic (e.g. social distancing, sheltering in place) in an effort to limit this patient's exposure and mitigate transmission in our community. All issues noted in this document were discussed and addressed.  A physical exam was not performed with this format.  I connected with Kathryn Singh on 11/04/18 at 9:10 AM  by telephone and verified that I am speaking with the correct person using two identifiers. Kathryn Singh is currently located at work and no one is currently with her during visit. The provider, Evelina Dun, FNP is located in their office at time of visit.  I discussed the limitations, risks, security and privacy concerns of performing an evaluation and management service by telephone and the availability of in person appointments. I also discussed with the patient that there may be a patient responsible charge related to this service. The patient expressed understanding and agreed to proceed.   History and Present Illness:  Pt calls today for chronic follow up. She states she has been out of her Effexor for the last 3 weeks.  Anxiety Presents for follow-up visit. Symptoms include decreased concentration, depressed mood, excessive worry, irritability, nervous/anxious behavior, panic and restlessness. The severity of symptoms is moderate. The quality of sleep is good.    Depression        Associated symptoms include decreased concentration, irritable and restlessness.  Associated symptoms include not sad.  Past treatments include nothing.  Past medical history includes anxiety.   Gastroesophageal Reflux She complains of belching and heartburn. This is a chronic problem. The symptoms are aggravated by certain foods. She has tried a PPI for the symptoms. The treatment provided mild relief.   Hyperlipidemia This is a chronic problem. The current episode started more than 1 year ago. Current antihyperlipidemic treatment includes statins. The current treatment provides mild improvement of lipids. Risk factors for coronary artery disease include dyslipidemia, a sedentary lifestyle and post-menopausal.      Review of Systems  Constitutional: Positive for irritability.  Gastrointestinal: Positive for heartburn.  Psychiatric/Behavioral: Positive for decreased concentration and depression. The patient is nervous/anxious.      Observations/Objective: No SOB or distress noted   Assessment and Plan: 1. Gastroesophageal reflux disease without esophagitis -Diet discussed- Avoid fried, spicy, citrus foods, caffeine and alcohol -Do not eat 2-3 hours before bedtime -Encouraged small frequent meals -Avoid NSAID's - omeprazole (PRILOSEC) 20 MG capsule; Take 1 capsule (20 mg total) by mouth daily.  Dispense: 90 capsule; Refill: 1 - CMP14+EGFR; Future  2. Mixed hyperlipidemia - CMP14+EGFR; Future  3. GAD (generalized anxiety disorder) Will restart Effexor Stress management discussed Follow up in 2 months  - venlafaxine XR (EFFEXOR XR) 37.5 MG 24 hr capsule; Take 1 capsule (37.5 mg total) by mouth daily with breakfast for 14 days, THEN 2 capsules (75 mg total) daily with breakfast for 14 days.  Dispense: 42 capsule; Refill: 0 - venlafaxine XR (EFFEXOR XR) 75 MG 24 hr capsule; Take 1 capsule (75 mg total) by mouth daily with breakfast.  Dispense: 90 capsule; Refill: 1 - CMP14+EGFR; Future  4. Hyperlipidemia, unspecified hyperlipidemia type - lovastatin (MEVACOR) 20 MG tablet; Take 1 tablet (20 mg total) by mouth at bedtime.  Dispense: 90 tablet; Refill: 3 - CMP14+EGFR; Future     I discussed the assessment and treatment plan with the patient. The patient was provided  an opportunity to ask questions and all were answered. The patient agreed with the plan and demonstrated an  understanding of the instructions.   The patient was advised to call back or seek an in-person evaluation if the symptoms worsen or if the condition fails to improve as anticipated.  The above assessment and management plan was discussed with the patient. The patient verbalized understanding of and has agreed to the management plan. Patient is aware to call the clinic if symptoms persist or worsen. Patient is aware when to return to the clinic for a follow-up visit. Patient educated on when it is appropriate to go to the emergency department.   Time call ended:  9:30 AM  I provided 20 minutes of non-face-to-face time during this encounter.    Evelina Dun, FNP

## 2019-04-24 ENCOUNTER — Other Ambulatory Visit: Payer: Self-pay | Admitting: Family

## 2019-04-24 DIAGNOSIS — F411 Generalized anxiety disorder: Secondary | ICD-10-CM

## 2019-04-27 NOTE — Telephone Encounter (Signed)
Patient needs to be seen.

## 2019-06-11 ENCOUNTER — Ambulatory Visit: Payer: Self-pay | Attending: Internal Medicine

## 2019-06-11 DIAGNOSIS — Z23 Encounter for immunization: Secondary | ICD-10-CM

## 2019-06-11 NOTE — Progress Notes (Signed)
   Covid-19 Vaccination Clinic  Name:  Kathryn Singh    MRN: [id number] DOB: May 16, 1975  06/11/2019  Kathryn Singh was observed post Covid-19 immunization for 15 minutes without incident. She was provided with Vaccine Information Sheet and instruction to access the V-Safe system.   Kathryn Singh was instructed to call 911 with any severe reactions post vaccine: Marland Kitchen Difficulty breathing  . Swelling of face and throat  . A fast heartbeat  . A bad rash all over body  . Dizziness and weakness   Immunizations Administered    Name Date Dose VIS Date Route   Moderna COVID-19 Vaccine 06/11/2019 10:24 AM 0.5 mL 02/17/2019 Intramuscular   Manufacturer: Moderna   Lot: [id number]   NDC: 82707-867-54

## 2019-07-09 ENCOUNTER — Other Ambulatory Visit: Payer: Self-pay

## 2019-07-09 ENCOUNTER — Encounter: Payer: Self-pay | Admitting: Family

## 2019-07-09 ENCOUNTER — Ambulatory Visit: Payer: BC Managed Care – PPO | Admitting: Family

## 2019-07-09 ENCOUNTER — Ambulatory Visit: Payer: Self-pay | Attending: Internal Medicine

## 2019-07-09 VITALS — BP 122/77 | HR 98 | Temp 97.9°F | Ht 63.0 in | Wt 144.0 lb

## 2019-07-09 DIAGNOSIS — Z23 Encounter for immunization: Secondary | ICD-10-CM

## 2019-07-09 DIAGNOSIS — M67479 Ganglion, unspecified ankle and foot: Secondary | ICD-10-CM | POA: Diagnosis not present

## 2019-07-09 MED ORDER — DICLOFENAC SODIUM 75 MG PO TBEC: 75.0000 mg | DELAYED_RELEASE_TABLET | Freq: Two times a day (BID) | ORAL | 0 refills | Status: DC

## 2019-07-09 NOTE — Progress Notes (Addendum)
Subjective:  Patient ID: Kathryn Singh, female    DOB: [birth year], 44 y.o.   MRN: [id number]  Patient Care Team: Sharion Balloon, FNP as PCP - General (Nurse Practitioner)   Chief Complaint:  Cyst (right foot tender to touch )   HPI: Kathryn Singh is a 44 y.o. female presenting on 07/09/2019 for Cyst (right foot tender to touch )   HPI 1. Ganglion cyst of foot Patient complaining of cyst on right foot that she noticed about 6 months ago. She reports it has grown larger, but denies any pain. She has not taken anything for it. She does report her shoes some times make it worse.      Relevant past medical, surgical, family, and social history reviewed and updated as indicated.  Allergies and medications reviewed and updated. Date reviewed: Chart in Epic.   Past Medical History:  Diagnosis Date  . GERD (gastroesophageal reflux disease)     Past Surgical History:  Procedure Laterality Date  . CESAREAN SECTION      Social History   Socioeconomic History  . Marital status: Married    Spouse name: Not on file  . Number of children: Not on file  . Years of education: Not on file  . Highest education level: Not on file  Occupational History  . Not on file  Tobacco Use  . Smoking status: Never Smoker  . Smokeless tobacco: Never Used  Substance and Sexual Activity  . Alcohol use: No  . Drug use: No  . Sexual activity: Not on file  Other Topics Concern  . Not on file  Social History Narrative  . Not on file   Social Determinants of Health   Financial Resource Strain:   . Difficulty of Paying Living Expenses:   Food Insecurity:   . Worried About Charity fundraiser in the Last Year:   . Arboriculturist in the Last Year:   Transportation Needs:   . Film/video editor (Medical):   Marland Kitchen Lack of Transportation (Non-Medical):   Physical Activity:   . Days of Exercise per Week:   . Minutes of Exercise per Session:   Stress:   . Feeling of Stress :   Social  Connections:   . Frequency of Communication with Friends and Family:   . Frequency of Social Gatherings with Friends and Family:   . Attends Religious Services:   . Active Member of Clubs or Organizations:   . Attends Archivist Meetings:   Marland Kitchen Marital Status:   Intimate Partner Violence:   . Fear of Current or Ex-Partner:   . Emotionally Abused:   Marland Kitchen Physically Abused:   . Sexually Abused:     Outpatient Encounter Medications as of 07/09/2019  Medication Sig  . cetirizine (ZYRTEC) 10 MG tablet Take 10 mg by mouth daily.  . fluticasone (FLONASE) 50 MCG/ACT nasal spray Place 2 sprays into both nostrils daily.  Marland Kitchen lovastatin (MEVACOR) 20 MG tablet Take 1 tablet (20 mg total) by mouth at bedtime.  Marland Kitchen omeprazole (PRILOSEC) 20 MG capsule Take 1 capsule (20 mg total) by mouth daily.  Marland Kitchen venlafaxine XR (EFFEXOR-XR) 75 MG 24 hr capsule TAKE 1 CAPSULE BY MOUTH ONCE DAILY WITH BREAKFAST  . diclofenac (VOLTAREN) 75 MG EC tablet Take 1 tablet (75 mg total) by mouth 2 (two) times daily.  . Olopatadine HCl 0.2 % SOLN PLACE 1 DROP INTO AFFECTED EYE(S) TWICE DAILY  . venlafaxine XR (EFFEXOR XR)  37.5 MG 24 hr capsule Take 1 capsule (37.5 mg total) by mouth daily with breakfast for 14 days, THEN 2 capsules (75 mg total) daily with breakfast for 14 days.  . [DISCONTINUED] Vitamin D, Ergocalciferol, (DRISDOL) 50000 units CAPS capsule Take 1 capsule (50,000 Units total) by mouth every 7 (seven) days. (Patient not taking: Reported on 07/09/2019)   No facility-administered encounter medications on file as of 07/09/2019.    No Known Allergies  Review of Systems  Skin:       Cyst on right lateral dorsum of foot.  All other systems reviewed and are negative.       Objective:  BP 122/77   Pulse 98   Temp 97.9 F (36.6 C) (Temporal)   Ht '5\' 3"'  (1.6 m)   Wt 144 lb (65.3 kg)   BMI 25.51 kg/m    Wt Readings from Last 3 Encounters:  07/09/19 144 lb (65.3 kg)  07/16/17 150 lb 12.8 oz (68.4 kg)    05/11/17 142 lb (64.4 kg)    Physical Exam Constitutional:      Appearance: Normal appearance.  HENT:     Head: Normocephalic.  Eyes:     Conjunctiva/sclera: Conjunctivae normal.  Cardiovascular:     Rate and Rhythm: Regular rhythm.     Heart sounds: Normal heart sounds.  Pulmonary:     Breath sounds: Normal breath sounds.  Abdominal:     Palpations: Abdomen is soft.  Skin:         Comments: Hard non- moveable, non tender cyst on right foot  Psychiatric:        Mood and Affect: Mood normal.     Results for orders placed or performed in visit on 07/16/17  CMP14+EGFR  Result Value Ref Range   Glucose 82 65 - 99 mg/dL   BUN 15 6 - 24 mg/dL   Creatinine, Ser 0.67 0.57 - 1.00 mg/dL   GFR calc non Af Amer 109 >59 mL/min/1.73   GFR calc Af Amer 125 >59 mL/min/1.73   BUN/Creatinine Ratio 22 9 - 23   Sodium 140 134 - 144 mmol/L   Potassium 4.1 3.5 - 5.2 mmol/L   Chloride 104 96 - 106 mmol/L   CO2 23 20 - 29 mmol/L   Calcium 9.1 8.7 - 10.2 mg/dL   Total Protein 7.2 6.0 - 8.5 g/dL   Albumin 4.3 3.5 - 5.5 g/dL   Globulin, Total 2.9 1.5 - 4.5 g/dL   Albumin/Globulin Ratio 1.5 1.2 - 2.2   Bilirubin Total 0.4 0.0 - 1.2 mg/dL   Alkaline Phosphatase 72 39 - 117 IU/L   AST 20 0 - 40 IU/L   ALT 20 0 - 32 IU/L  Lipid panel  Result Value Ref Range   Cholesterol, Total 191 100 - 199 mg/dL   Triglycerides 157 (H) 0 - 149 mg/dL   HDL 47 >39 mg/dL   VLDL Cholesterol Cal 31 5 - 40 mg/dL   LDL Calculated 113 (H) 0 - 99 mg/dL   Chol/HDL Ratio 4.1 0.0 - 4.4 ratio  VITAMIN D 25 Hydroxy (Vit-D Deficiency, Fractures)  Result Value Ref Range   Vit D, 25-Hydroxy 21.2 (L) 30.0 - 100.0 ng/mL       Pertinent labs & imaging results that were available during my care of the patient were reviewed by me and considered in my medical decision making.  Assessment & Plan:  Kathryn Singh was seen today for cyst.  Diagnoses and all orders for this visit:  Ganglion  cyst of foot -     diclofenac  (VOLTAREN) 75 MG EC tablet; Take 1 tablet (75 mg total) by mouth 2 (two) times daily.    Avoid any injury or repetitive motion No other NSAID's while taking diclofenac. Take BID with food for next 5-7 days Continue all other maintenance medications.  Follow up plan: No follow-ups on file.   Medical decision-making:  10 minutes spent today reviewing the medical chart, counseling the patient/family, and documenting today's visit.  Continue healthy lifestyle choices, including diet (rich in fruits, vegetables, and lean proteins, and low in salt and simple carbohydrates) and exercise (at least 30 minutes of moderate physical activity daily).  Educational handout given for ganglion cyst.  The above assessment and management plan was discussed with the patient. The patient verbalized understanding of and has agreed to the management plan. Patient is aware to call the clinic if they develop any new symptoms or if symptoms persist or worsen. Patient is aware when to return to the clinic for a follow-up visit. Patient educated on when it is appropriate to go to the emergency department.   Robynn Pane, RN, FNP student  Evelina Dun, [facility] [phone]

## 2019-07-09 NOTE — Progress Notes (Signed)
   Covid-19 Vaccination Clinic  Name:  Kathryn Singh    MRN: [id number] DOB: 1975-09-26  07/09/2019  Kathryn Singh was observed post Covid-19 immunization for 15 minutes without incident. She was provided with Vaccine Information Sheet and instruction to access the V-Safe system.   Kathryn Singh was instructed to call 911 with any severe reactions post vaccine: Marland Kitchen Difficulty breathing  . Swelling of face and throat  . A fast heartbeat  . A bad rash all over body  . Dizziness and weakness   Immunizations Administered    Name Date Dose VIS Date Route   Moderna COVID-19 Vaccine 07/09/2019  9:58 AM 0.5 mL 02/2019 Intramuscular   Manufacturer: Moderna   Lot: [id number]   NDC: 37543-606-77

## 2019-07-09 NOTE — Patient Instructions (Signed)
Ganglion Cyst  A ganglion cyst is a non-cancerous, fluid-filled lump that occurs near a joint or tendon. The cyst grows out of a joint or the lining of a tendon. Ganglion cysts most often develop in the hand or wrist, but they can also develop in the shoulder, elbow, hip, knee, ankle, or foot. Ganglion cysts are ball-shaped or egg-shaped. Their size can range from the size of a pea to larger than a grape. Increased activity may cause the cyst to get bigger because more fluid starts to build up. What are the causes? The exact cause of this condition is not known, but it may be related to:  Inflammation or irritation around the joint.  An injury.  Repetitive movements or overuse.  Arthritis. What increases the risk? You are more likely to develop this condition if:  You are a woman.  You are 15-40 years old. What are the signs or symptoms? The main symptom of this condition is a lump. It most often appears on the hand or wrist. In many cases, there are no other symptoms, but a cyst can sometimes cause:  Tingling.  Pain.  Numbness.  Muscle weakness.  Weak grip.  Less range of motion in a joint. How is this diagnosed? Ganglion cysts are usually diagnosed based on a physical exam. Your health care provider will feel the lump and may shine a light next to it. If it is a ganglion cyst, the light will likely shine through it. Your health care provider may order an X-ray, ultrasound, or MRI to rule out other conditions. How is this treated? Ganglion cysts often go away on their own without treatment. If you have pain or other symptoms, treatment may be needed. Treatment is also needed if the ganglion cyst limits your movement or if it gets infected. Treatment may include:  Wearing a brace or splint on your wrist or finger.  Taking anti-inflammatory medicine.  Having fluid drained from the lump with a needle (aspiration).  Getting a steroid injected into the joint.  Having  surgery to remove the ganglion cyst.  Placing a pad on your shoe or wearing shoes that will not rub against the cyst if it is on your foot. Follow these instructions at home:  Do not press on the ganglion cyst, poke it with a needle, or hit it.  Take over-the-counter and prescription medicines only as told by your health care provider.  If you have a brace or splint: ? Wear it as told by your health care provider. ? Remove it as told by your health care provider. Ask if you need to remove it when you take a shower or a bath.  Watch your ganglion cyst for any changes.  Keep all follow-up visits as told by your health care provider. This is important. Contact a health care provider if:  Your ganglion cyst becomes larger or more painful.  You have pus coming from the lump.  You have weakness or numbness in the affected area.  You have a fever or chills. Get help right away if:  You have a fever and have any of these in the cyst area: ? Increased redness. ? Red streaks. ? Swelling. Summary  A ganglion cyst is a non-cancerous, fluid-filled lump that occurs near a joint or tendon.  Ganglion cysts most often develop in the hand or wrist, but they can also develop in the shoulder, elbow, hip, knee, ankle, or foot.  Ganglion cysts often go away on their own without treatment.   This information is not intended to replace advice given to you by your health care provider. Make sure you discuss any questions you have with your health care provider. Document Revised: 02/15/2017 Document Reviewed: 11/02/2016 Elsevier Patient Education  2020 Elsevier Inc.  

## 2019-12-05 DIAGNOSIS — R82998 Other abnormal findings in urine: Secondary | ICD-10-CM | POA: Diagnosis not present

## 2019-12-05 DIAGNOSIS — N390 Urinary tract infection, site not specified: Secondary | ICD-10-CM | POA: Diagnosis not present

## 2019-12-05 DIAGNOSIS — R300 Dysuria: Secondary | ICD-10-CM | POA: Diagnosis not present

## 2019-12-05 DIAGNOSIS — A499 Bacterial infection, unspecified: Secondary | ICD-10-CM | POA: Diagnosis not present

## 2019-12-10 ENCOUNTER — Other Ambulatory Visit: Payer: Self-pay | Admitting: Family

## 2019-12-10 DIAGNOSIS — F411 Generalized anxiety disorder: Secondary | ICD-10-CM

## 2019-12-10 NOTE — Telephone Encounter (Signed)
[facility] NTBS LOV 11/04/18

## 2019-12-11 NOTE — Telephone Encounter (Signed)
Spoke with pt and she is currently working and states she will call back to schedule her appointment.

## 2019-12-18 ENCOUNTER — Ambulatory Visit (INDEPENDENT_AMBULATORY_CARE_PROVIDER_SITE_OTHER): Payer: BC Managed Care – PPO | Admitting: Family

## 2019-12-18 ENCOUNTER — Other Ambulatory Visit: Payer: Self-pay

## 2019-12-18 ENCOUNTER — Encounter: Payer: Self-pay | Admitting: Family

## 2019-12-18 VITALS — BP 119/68 | HR 94 | Temp 98.2°F | Ht 63.0 in | Wt 149.0 lb

## 2019-12-18 DIAGNOSIS — F411 Generalized anxiety disorder: Secondary | ICD-10-CM

## 2019-12-18 DIAGNOSIS — R6889 Other general symptoms and signs: Secondary | ICD-10-CM | POA: Diagnosis not present

## 2019-12-18 DIAGNOSIS — E782 Mixed hyperlipidemia: Secondary | ICD-10-CM

## 2019-12-18 DIAGNOSIS — K219 Gastro-esophageal reflux disease without esophagitis: Secondary | ICD-10-CM

## 2019-12-18 DIAGNOSIS — Z1159 Encounter for screening for other viral diseases: Secondary | ICD-10-CM

## 2019-12-18 DIAGNOSIS — Z713 Dietary counseling and surveillance: Secondary | ICD-10-CM

## 2019-12-18 MED ORDER — OMEPRAZOLE 20 MG PO CPDR: 20.0000 mg | DELAYED_RELEASE_CAPSULE | Freq: Every day | ORAL | 1 refills | Status: DC

## 2019-12-18 MED ORDER — PHENTERMINE HCL 37.5 MG PO TABS: 37.5000 mg | ORAL_TABLET | Freq: Every day | ORAL | 1 refills | Status: DC

## 2019-12-18 MED ORDER — VENLAFAXINE HCL ER 75 MG PO CP24: ORAL_CAPSULE | ORAL | 3 refills | Status: DC

## 2019-12-18 NOTE — Progress Notes (Signed)
Subjective:    Patient ID: Kathryn Singh, female    DOB: [birth year], 44 y.o.   MRN: [id number]  Chief Complaint  Patient presents with  . Medical Management of Chronic Issues   Pt presents to chronic follow up. Pt requesting medication to help with weight loss. States she has tried exercising and decreasing calories without relief. Requesting a couple months of phentermine to help jump start her.   Gastroesophageal Reflux She complains of belching and heartburn. This is a chronic problem. The current episode started more than 1 year ago. The problem occurs occasionally. The problem has been waxing and waning. She has tried a PPI for the symptoms. The treatment provided moderate relief.  Anxiety Presents for follow-up visit. Symptoms include excessive worry, irritability, nervous/anxious behavior and restlessness. Symptoms occur occasionally.    Hyperlipidemia This is a chronic problem. The current episode started more than 1 year ago. The problem is uncontrolled. Recent lipid tests were reviewed and are high. Exacerbating diseases include obesity. Current antihyperlipidemic treatment includes diet change and statins. The current treatment provides moderate improvement of lipids.  Back Pain This is a new problem. The current episode started in the past 7 days. The problem occurs intermittently. The problem has been waxing and waning since onset. Pain location: left lower  The pain is at a severity of 7/10. Pertinent negatives include no dysuria (did last week, but resolved).      Review of Systems  Constitutional: Positive for irritability.  Gastrointestinal: Positive for heartburn.  Genitourinary: Negative for dysuria (did last week, but resolved).  Musculoskeletal: Positive for back pain.  Psychiatric/Behavioral: The patient is nervous/anxious.   All other systems reviewed and are negative.      Objective:   Physical Exam Constitutional:      Appearance: Normal appearance.    HENT:     Head: Normocephalic.     Right Ear: Tympanic membrane normal.     Left Ear: Tympanic membrane normal.     Nose: Nose normal.     Mouth/Throat:     Mouth: Mucous membranes are moist.  Eyes:     Pupils: Pupils are equal, round, and reactive to light.  Musculoskeletal:        General: Normal range of motion.  Skin:    General: Skin is warm.  Neurological:     General: No focal deficit present.     Mental Status: She is alert.  Psychiatric:        Mood and Affect: Mood normal.       BP 119/68   Pulse 94   Temp 98.2 F (36.8 C) (Temporal)   Ht '5\' 3"'  (1.6 m)   Wt 149 lb (67.6 kg)   BMI 26.39 kg/m      Assessment & Plan:  Kathryn Singh comes in today with chief complaint of Medical Management of Chronic Issues   Diagnosis and orders addressed:  1. GAD (generalized anxiety disorder) - venlafaxine XR (EFFEXOR-XR) 75 MG 24 hr capsule; TAKE 1 CAPSULE BY MOUTH ONCE DAILY WITH BREAKFAST  Dispense: 90 capsule; Refill: 3 - CMP14+EGFR - CBC with Differential/Platelet  2. Gastroesophageal reflux disease without esophagitis - CMP14+EGFR - CBC with Differential/Platelet - omeprazole (PRILOSEC) 20 MG capsule; Take 1 capsule (20 mg total) by mouth daily.  Dispense: 90 capsule; Refill: 1  3. Mixed hyperlipidemia - CMP14+EGFR - CBC with Differential/Platelet  5. Need for hepatitis C screening test - CMP14+EGFR - CBC with Differential/Platelet - Hepatitis C antibody  6.  Weight loss counseling, encounter for Will only given two months of phentermine  - phentermine (ADIPEX-P) 37.5 MG tablet; Take 1 tablet (37.5 mg total) by mouth daily before breakfast.  Dispense: 30 tablet; Refill: 1   Labs pending Health Maintenance reviewed Diet and exercise encouraged  Follow up plan: 6 months and will schedule pap   Evelina Dun, FNP

## 2019-12-18 NOTE — Patient Instructions (Signed)
Health Maintenance, Female Adopting a healthy lifestyle and getting preventive care are important in promoting health and wellness. Ask your health care provider about:  The right schedule for you to have regular tests and exams.  Things you can do on your own to prevent diseases and keep yourself healthy. What should I know about diet, weight, and exercise? Eat a healthy diet   Eat a diet that includes plenty of vegetables, fruits, low-fat dairy products, and lean protein.  Do not eat a lot of foods that are high in solid fats, added sugars, or sodium. Maintain a healthy weight Body mass index (BMI) is used to identify weight problems. It estimates body fat based on height and weight. Your health care provider can help determine your BMI and help you achieve or maintain a healthy weight. Get regular exercise Get regular exercise. This is one of the most important things you can do for your health. Most adults should:  Exercise for at least 150 minutes each week. The exercise should increase your heart rate and make you sweat (moderate-intensity exercise).  Do strengthening exercises at least twice a week. This is in addition to the moderate-intensity exercise.  Spend less time sitting. Even light physical activity can be beneficial. Watch cholesterol and blood lipids Have your blood tested for lipids and cholesterol at 44 years of age, then have this test every 5 years. Have your cholesterol levels checked more often if:  Your lipid or cholesterol levels are high.  You are older than 44 years of age.  You are at high risk for heart disease. What should I know about cancer screening? Depending on your health history and family history, you may need to have cancer screening at various ages. This may include screening for:  Breast cancer.  Cervical cancer.  Colorectal cancer.  Skin cancer.  Lung cancer. What should I know about heart disease, diabetes, and high blood  pressure? Blood pressure and heart disease  High blood pressure causes heart disease and increases the risk of stroke. This is more likely to develop in people who have high blood pressure readings, are of African descent, or are overweight.  Have your blood pressure checked: ? Every 3-5 years if you are 18-39 years of age. ? Every year if you are 40 years old or older. Diabetes Have regular diabetes screenings. This checks your fasting blood sugar level. Have the screening done:  Once every three years after age 40 if you are at a normal weight and have a low risk for diabetes.  More often and at a younger age if you are overweight or have a high risk for diabetes. What should I know about preventing infection? Hepatitis B If you have a higher risk for hepatitis B, you should be screened for this virus. Talk with your health care provider to find out if you are at risk for hepatitis B infection. Hepatitis C Testing is recommended for:  Everyone born from 1945 through 1965.  Anyone with known risk factors for hepatitis C. Sexually transmitted infections (STIs)  Get screened for STIs, including gonorrhea and chlamydia, if: ? You are sexually active and are younger than 44 years of age. ? You are older than 44 years of age and your health care provider tells you that you are at risk for this type of infection. ? Your sexual activity has changed since you were last screened, and you are at increased risk for chlamydia or gonorrhea. Ask your health care provider if   you are at risk.  Ask your health care provider about whether you are at high risk for HIV. Your health care provider may recommend a prescription medicine to help prevent HIV infection. If you choose to take medicine to prevent HIV, you should first get tested for HIV. You should then be tested every 3 months for as long as you are taking the medicine. Pregnancy  If you are about to stop having your period (premenopausal) and  you may become pregnant, seek counseling before you get pregnant.  Take 400 to 800 micrograms (mcg) of folic acid every day if you become pregnant.  Ask for birth control (contraception) if you want to prevent pregnancy. Osteoporosis and menopause Osteoporosis is a disease in which the bones lose minerals and strength with aging. This can result in bone fractures. If you are 65 years old or older, or if you are at risk for osteoporosis and fractures, ask your health care provider if you should:  Be screened for bone loss.  Take a calcium or vitamin D supplement to lower your risk of fractures.  Be given hormone replacement therapy (HRT) to treat symptoms of menopause. Follow these instructions at home: Lifestyle  Do not use any products that contain nicotine or tobacco, such as cigarettes, e-cigarettes, and chewing tobacco. If you need help quitting, ask your health care provider.  Do not use street drugs.  Do not share needles.  Ask your health care provider for help if you need support or information about quitting drugs. Alcohol use  Do not drink alcohol if: ? Your health care provider tells you not to drink. ? You are pregnant, may be pregnant, or are planning to become pregnant.  If you drink alcohol: ? Limit how much you use to 0-1 drink a day. ? Limit intake if you are breastfeeding.  Be aware of how much alcohol is in your drink. In the [place] one drink equals one 12 oz bottle of beer (355 mL), one 5 oz glass of wine (148 mL), or one 1 oz glass of hard liquor (44 mL). General instructions  Schedule regular health, dental, and eye exams.  Stay current with your vaccines.  Tell your health care provider if: ? You often feel depressed. ? You have ever been abused or do not feel safe at home. Summary  Adopting a healthy lifestyle and getting preventive care are important in promoting health and wellness.  Follow your health care provider's instructions about healthy  diet, exercising, and getting tested or screened for diseases.  Follow your health care provider's instructions on monitoring your cholesterol and blood pressure. This information is not intended to replace advice given to you by your health care provider. Make sure you discuss any questions you have with your health care provider. Document Revised: 02/26/2018 Document Reviewed: 02/26/2018 Elsevier Patient Education  2020 Elsevier Inc.  

## 2019-12-19 LAB — CBC WITH DIFFERENTIAL/PLATELET
Basophils Absolute: 0.1 10*3/uL (ref 0.0–0.2)
Basos: 1 %
EOS (ABSOLUTE): 0.3 10*3/uL (ref 0.0–0.4)
Eos: 3 %
Hematocrit: 31.3 % — ABNORMAL LOW (ref 34.0–46.6)
Hemoglobin: 10 g/dL — ABNORMAL LOW (ref 11.1–15.9)
Immature Grans (Abs): 0 10*3/uL (ref 0.0–0.1)
Immature Granulocytes: 0 %
Lymphocytes Absolute: 2.5 10*3/uL (ref 0.7–3.1)
Lymphs: 29 %
MCH: 24.9 pg — ABNORMAL LOW (ref 26.6–33.0)
MCHC: 31.9 g/dL (ref 31.5–35.7)
MCV: 78 fL — ABNORMAL LOW (ref 79–97)
Monocytes Absolute: 0.7 10*3/uL (ref 0.1–0.9)
Monocytes: 8 %
Neutrophils Absolute: 5.1 10*3/uL (ref 1.4–7.0)
Neutrophils: 59 %
Platelets: 372 10*3/uL (ref 150–450)
RBC: 4.01 x10E6/uL (ref 3.77–5.28)
RDW: 16.5 % — ABNORMAL HIGH (ref 11.7–15.4)
WBC: 8.6 10*3/uL (ref 3.4–10.8)

## 2019-12-19 LAB — CMP14+EGFR
ALT: 26 IU/L (ref 0–32)
AST: 25 IU/L (ref 0–40)
Albumin/Globulin Ratio: 1.4 (ref 1.2–2.2)
Albumin: 4.3 g/dL (ref 3.8–4.8)
Alkaline Phosphatase: 85 IU/L (ref 44–121)
BUN/Creatinine Ratio: 20 (ref 9–23)
BUN: 13 mg/dL (ref 6–24)
Bilirubin Total: <0.2 mg/dL (ref 0.0–1.2)
CO2: 21 mmol/L (ref 20–29)
Calcium: 9.2 mg/dL (ref 8.7–10.2)
Chloride: 106 mmol/L (ref 96–106)
Creatinine, Ser: 0.64 mg/dL (ref 0.57–1.00)
GFR calc Af Amer: 126 mL/min/{1.73_m2} (ref >59)
GFR calc non Af Amer: 109 mL/min/{1.73_m2} (ref >59)
Globulin, Total: 3 g/dL (ref 1.5–4.5)
Glucose: 81 mg/dL (ref 65–99)
Potassium: 3.7 mmol/L (ref 3.5–5.2)
Sodium: 139 mmol/L (ref 134–144)
Total Protein: 7.3 g/dL (ref 6.0–8.5)

## 2019-12-19 LAB — HEPATITIS C ANTIBODY: Hep C Virus Ab: <0.1 s/co ratio (ref 0.0–0.9)

## 2019-12-23 LAB — IRON: Iron: 20 ug/dL — ABNORMAL LOW (ref 27–159)

## 2019-12-23 LAB — SPECIMEN STATUS REPORT

## 2019-12-23 LAB — VITAMIN B12: Vitamin B-12: 514 pg/mL (ref 232–1245)

## 2020-01-07 ENCOUNTER — Encounter: Payer: Self-pay | Admitting: Family Medicine

## 2020-02-08 DIAGNOSIS — Z23 Encounter for immunization: Secondary | ICD-10-CM | POA: Diagnosis not present

## 2020-02-28 ENCOUNTER — Other Ambulatory Visit: Payer: Self-pay | Admitting: Family

## 2020-02-28 DIAGNOSIS — Z713 Dietary counseling and surveillance: Secondary | ICD-10-CM

## 2020-02-29 ENCOUNTER — Telehealth: Payer: Self-pay

## 2020-02-29 NOTE — Telephone Encounter (Signed)
[facility] requested medication, phentermine can only be refilled during a visit. OV notes in October stated that only 2 months would be given. Pt will have to wait till her visit on 03/21/20.

## 2020-02-29 NOTE — Telephone Encounter (Signed)
  Prescription Request  02/29/2020  What is the name of the medication or equipment?  Weight Loss meds?  Have you contacted your pharmacy to request a refill? (if applicable) yes  Which pharmacy would you like this sent to? [facility]   Patient notified that their request is being sent to the clinical staff for review and that they should receive a response within 2 business days.   Kathryn Singh' pt.  Pt has an appt in Jan for cpe.  Please call pt.

## 2020-03-21 ENCOUNTER — Encounter: Payer: BC Managed Care – PPO | Admitting: Family

## 2020-03-25 ENCOUNTER — Other Ambulatory Visit: Payer: Self-pay

## 2020-03-25 ENCOUNTER — Ambulatory Visit (INDEPENDENT_AMBULATORY_CARE_PROVIDER_SITE_OTHER): Payer: BC Managed Care – PPO | Admitting: Family

## 2020-03-25 ENCOUNTER — Encounter: Payer: Self-pay | Admitting: Family

## 2020-03-25 ENCOUNTER — Other Ambulatory Visit (HOSPITAL_COMMUNITY)
Admission: RE | Admit: 2020-03-25 | Discharge: 2020-03-25 | Disposition: A | Discharge status: Home / Self Care | Payer: BC Managed Care – PPO | Source: Ambulatory Visit | Attending: Family | Admitting: Family

## 2020-03-25 VITALS — BP 116/74 | HR 98 | Temp 97.3°F | Ht 63.0 in | Wt 142.8 lb

## 2020-03-25 DIAGNOSIS — Z0001 Encounter for general adult medical examination with abnormal findings: Secondary | ICD-10-CM | POA: Diagnosis not present

## 2020-03-25 DIAGNOSIS — Z0000 Encounter for general adult medical examination without abnormal findings: Secondary | ICD-10-CM

## 2020-03-25 DIAGNOSIS — Z713 Dietary counseling and surveillance: Secondary | ICD-10-CM

## 2020-03-25 DIAGNOSIS — E782 Mixed hyperlipidemia: Secondary | ICD-10-CM

## 2020-03-25 DIAGNOSIS — F411 Generalized anxiety disorder: Secondary | ICD-10-CM | POA: Diagnosis not present

## 2020-03-25 DIAGNOSIS — E559 Vitamin D deficiency, unspecified: Secondary | ICD-10-CM

## 2020-03-25 DIAGNOSIS — Z01419 Encounter for gynecological examination (general) (routine) without abnormal findings: Secondary | ICD-10-CM

## 2020-03-25 DIAGNOSIS — K219 Gastro-esophageal reflux disease without esophagitis: Secondary | ICD-10-CM

## 2020-03-25 MED ORDER — PHENTERMINE HCL 37.5 MG PO TABS: 37.5000 mg | ORAL_TABLET | Freq: Every day | ORAL | 1 refills | Status: DC

## 2020-03-25 NOTE — Patient Instructions (Signed)
Health Maintenance, Female Adopting a healthy lifestyle and getting preventive care are important in promoting health and wellness. Ask your health care provider about:  The right schedule for you to have regular tests and exams.  Things you can do on your own to prevent diseases and keep yourself healthy. What should I know about diet, weight, and exercise? Eat a healthy diet   Eat a diet that includes plenty of vegetables, fruits, low-fat dairy products, and lean protein.  Do not eat a lot of foods that are high in solid fats, added sugars, or sodium. Maintain a healthy weight Body mass index (BMI) is used to identify weight problems. It estimates body fat based on height and weight. Your health care provider can help determine your BMI and help you achieve or maintain a healthy weight. Get regular exercise Get regular exercise. This is one of the most important things you can do for your health. Most adults should:  Exercise for at least 150 minutes each week. The exercise should increase your heart rate and make you sweat (moderate-intensity exercise).  Do strengthening exercises at least twice a week. This is in addition to the moderate-intensity exercise.  Spend less time sitting. Even light physical activity can be beneficial. Watch cholesterol and blood lipids Have your blood tested for lipids and cholesterol at 45 years of age, then have this test every 5 years. Have your cholesterol levels checked more often if:  Your lipid or cholesterol levels are high.  You are older than 45 years of age.  You are at high risk for heart disease. What should I know about cancer screening? Depending on your health history and family history, you may need to have cancer screening at various ages. This may include screening for:  Breast cancer.  Cervical cancer.  Colorectal cancer.  Skin cancer.  Lung cancer. What should I know about heart disease, diabetes, and high blood  pressure? Blood pressure and heart disease  High blood pressure causes heart disease and increases the risk of stroke. This is more likely to develop in people who have high blood pressure readings, are of African descent, or are overweight.  Have your blood pressure checked: ? Every 3-5 years if you are 18-39 years of age. ? Every year if you are 40 years old or older. Diabetes Have regular diabetes screenings. This checks your fasting blood sugar level. Have the screening done:  Once every three years after age 40 if you are at a normal weight and have a low risk for diabetes.  More often and at a younger age if you are overweight or have a high risk for diabetes. What should I know about preventing infection? Hepatitis B If you have a higher risk for hepatitis B, you should be screened for this virus. Talk with your health care provider to find out if you are at risk for hepatitis B infection. Hepatitis C Testing is recommended for:  Everyone born from 1945 through 1965.  Anyone with known risk factors for hepatitis C. Sexually transmitted infections (STIs)  Get screened for STIs, including gonorrhea and chlamydia, if: ? You are sexually active and are younger than 45 years of age. ? You are older than 45 years of age and your health care provider tells you that you are at risk for this type of infection. ? Your sexual activity has changed since you were last screened, and you are at increased risk for chlamydia or gonorrhea. Ask your health care provider if   you are at risk.  Ask your health care provider about whether you are at high risk for HIV. Your health care provider may recommend a prescription medicine to help prevent HIV infection. If you choose to take medicine to prevent HIV, you should first get tested for HIV. You should then be tested every 3 months for as long as you are taking the medicine. Pregnancy  If you are about to stop having your period (premenopausal) and  you may become pregnant, seek counseling before you get pregnant.  Take 400 to 800 micrograms (mcg) of folic acid every day if you become pregnant.  Ask for birth control (contraception) if you want to prevent pregnancy. Osteoporosis and menopause Osteoporosis is a disease in which the bones lose minerals and strength with aging. This can result in bone fractures. If you are 65 years old or older, or if you are at risk for osteoporosis and fractures, ask your health care provider if you should:  Be screened for bone loss.  Take a calcium or vitamin D supplement to lower your risk of fractures.  Be given hormone replacement therapy (HRT) to treat symptoms of menopause. Follow these instructions at home: Lifestyle  Do not use any products that contain nicotine or tobacco, such as cigarettes, e-cigarettes, and chewing tobacco. If you need help quitting, ask your health care provider.  Do not use street drugs.  Do not share needles.  Ask your health care provider for help if you need support or information about quitting drugs. Alcohol use  Do not drink alcohol if: ? Your health care provider tells you not to drink. ? You are pregnant, may be pregnant, or are planning to become pregnant.  If you drink alcohol: ? Limit how much you use to 0-1 drink a day. ? Limit intake if you are breastfeeding.  Be aware of how much alcohol is in your drink. In the [place] one drink equals one 12 oz bottle of beer (355 mL), one 5 oz glass of wine (148 mL), or one 1 oz glass of hard liquor (44 mL). General instructions  Schedule regular health, dental, and eye exams.  Stay current with your vaccines.  Tell your health care provider if: ? You often feel depressed. ? You have ever been abused or do not feel safe at home. Summary  Adopting a healthy lifestyle and getting preventive care are important in promoting health and wellness.  Follow your health care provider's instructions about healthy  diet, exercising, and getting tested or screened for diseases.  Follow your health care provider's instructions on monitoring your cholesterol and blood pressure. This information is not intended to replace advice given to you by your health care provider. Make sure you discuss any questions you have with your health care provider. Document Revised: 02/26/2018 Document Reviewed: 02/26/2018 Elsevier Patient Education  2020 [employer].  

## 2020-03-25 NOTE — Progress Notes (Signed)
Subjective:    Patient ID: Kathryn Singh, female    DOB: 1975/11/13, 45 y.o.   MRN: [id number]  Chief Complaint  Patient presents with  . Annual Exam    With pap    PT presents to the office today for CPE with pap. She reports she has not taken the phentermine in the last month. She reports she has lost 7.5 lbs since starting.  Gastroesophageal Reflux She complains of belching and heartburn. She reports no chest pain. This is a chronic problem. The current episode started more than 1 year ago. The problem occurs occasionally. The problem has been waxing and waning. She has tried a PPI for the symptoms. The treatment provided moderate relief.  Hyperlipidemia This is a chronic problem. The current episode started more than 1 year ago. Pertinent negatives include no chest pain. Current antihyperlipidemic treatment includes diet change. The current treatment provides mild improvement of lipids. Risk factors for coronary artery disease include a sedentary lifestyle.  Anxiety Presents for follow-up visit. Symptoms include irritability and nervous/anxious behavior. Patient reports no chest pain or dry mouth. Symptoms occur occasionally. The severity of symptoms is moderate.        Review of Systems  Constitutional: Positive for irritability.  Cardiovascular: Negative for chest pain.  Gastrointestinal: Positive for heartburn.  Psychiatric/Behavioral: The patient is nervous/anxious.   All other systems reviewed and are negative.      Objective:   Physical Exam Vitals reviewed.  Constitutional:      General: She is not in acute distress.    Appearance: She is well-developed and well-nourished.  HENT:     Head: Normocephalic and atraumatic.     Right Ear: Tympanic membrane normal.     Left Ear: Tympanic membrane normal.     Mouth/Throat:     Mouth: Oropharynx is clear and moist.  Eyes:     Pupils: Pupils are equal, round, and reactive to light.  Neck:     Thyroid: No thyromegaly.   Cardiovascular:     Rate and Rhythm: Normal rate and regular rhythm.     Pulses: Intact distal pulses.     Heart sounds: Normal heart sounds. No murmur heard.   Pulmonary:     Effort: Pulmonary effort is normal. No respiratory distress.     Breath sounds: Normal breath sounds. No wheezing.  Abdominal:     General: Bowel sounds are normal. There is no distension.     Palpations: Abdomen is soft.     Tenderness: There is no abdominal tenderness.  Genitourinary:    Comments: Bimanual exam- no adnexal masses or tenderness, ovaries nonpalpable   Cervix parous and pink- No discharge  Musculoskeletal:        General: No tenderness or edema. Normal range of motion.     Cervical back: Normal range of motion and neck supple.  Skin:    General: Skin is warm and dry.  Neurological:     Mental Status: She is alert and oriented to person, place, and time.     Cranial Nerves: No cranial nerve deficit.     Deep Tendon Reflexes: Reflexes are normal and symmetric.  Psychiatric:        Mood and Affect: Mood and affect normal.        Behavior: Behavior normal.        Thought Content: Thought content normal.        Judgment: Judgment normal.       BP 116/74   Pulse  98   Temp (!) 97.3 F (36.3 C) (Temporal)   Ht _0  (1.6 m)   Wt 142 lb 12.8 oz (64.8 kg)   BMI 25.30 kg/m      Assessment & Plan:  Kathryn Singh comes in today with chief complaint of Annual Exam (With pap )   Diagnosis and orders addressed:  1. Weight loss counseling, encounter for - phentermine (ADIPEX-P) 37.5 MG tablet; Take 1 tablet (37.5 mg total) by mouth daily before breakfast.  Dispense: 30 tablet; Refill: 1 - CMP14+EGFR - CBC with Differential/Platelet  2. Annual physical exam - CMP14+EGFR - CBC with Differential/Platelet - Lipid panel - TSH - Cytology - PAP([place])  3. Encounter for gynecological examination without abnormal finding - CMP14+EGFR - CBC with Differential/Platelet - Cytology -  PAP([place])  4. GAD (generalized anxiety disorder) - CMP14+EGFR - CBC with Differential/Platelet  5. Vitamin D deficiency - CMP14+EGFR - CBC with Differential/Platelet  6. Mixed hyperlipidemia - CMP14+EGFR - CBC with Differential/Platelet  7. Gastroesophageal reflux disease without esophagitis - CMP14+EGFR - CBC with Differential/Platelet   Labs pending Health Maintenance reviewed Diet and exercise encouraged  Follow up plan: 1 year    Evelina Dun, FNP

## 2020-03-26 LAB — CBC WITH DIFFERENTIAL/PLATELET
Basophils Absolute: 0 10*3/uL (ref 0.0–0.2)
Basos: 0 %
EOS (ABSOLUTE): 0.1 10*3/uL (ref 0.0–0.4)
Eos: 1 %
Hematocrit: 33.6 % — ABNORMAL LOW (ref 34.0–46.6)
Hemoglobin: 10.2 g/dL — ABNORMAL LOW (ref 11.1–15.9)
Immature Grans (Abs): 0 10*3/uL (ref 0.0–0.1)
Immature Granulocytes: 0 %
Lymphocytes Absolute: 2.2 10*3/uL (ref 0.7–3.1)
Lymphs: 24 %
MCH: 23.8 pg — ABNORMAL LOW (ref 26.6–33.0)
MCHC: 30.4 g/dL — ABNORMAL LOW (ref 31.5–35.7)
MCV: 79 fL (ref 79–97)
Monocytes Absolute: 0.7 10*3/uL (ref 0.1–0.9)
Monocytes: 7 %
Neutrophils Absolute: 6.3 10*3/uL (ref 1.4–7.0)
Neutrophils: 68 %
Platelets: 369 10*3/uL (ref 150–450)
RBC: 4.28 x10E6/uL (ref 3.77–5.28)
RDW: 16.3 % — ABNORMAL HIGH (ref 11.7–15.4)
WBC: 9.3 10*3/uL (ref 3.4–10.8)

## 2020-03-26 LAB — CMP14+EGFR
ALT: 28 IU/L (ref 0–32)
AST: 24 IU/L (ref 0–40)
Albumin/Globulin Ratio: 1.4 (ref 1.2–2.2)
Albumin: 4.3 g/dL (ref 3.8–4.8)
Alkaline Phosphatase: 85 IU/L (ref 44–121)
BUN/Creatinine Ratio: 18 (ref 9–23)
BUN: 12 mg/dL (ref 6–24)
Bilirubin Total: 0.3 mg/dL (ref 0.0–1.2)
CO2: 21 mmol/L (ref 20–29)
Calcium: 9.2 mg/dL (ref 8.7–10.2)
Chloride: 106 mmol/L (ref 96–106)
Creatinine, Ser: 0.66 mg/dL (ref 0.57–1.00)
GFR calc Af Amer: 124 mL/min/{1.73_m2} (ref >59)
GFR calc non Af Amer: 108 mL/min/{1.73_m2} (ref >59)
Globulin, Total: 3.1 g/dL (ref 1.5–4.5)
Glucose: 90 mg/dL (ref 65–99)
Potassium: 4.2 mmol/L (ref 3.5–5.2)
Sodium: 140 mmol/L (ref 134–144)
Total Protein: 7.4 g/dL (ref 6.0–8.5)

## 2020-03-26 LAB — TSH: TSH: 2 u[IU]/mL (ref 0.450–4.500)

## 2020-03-26 LAB — LIPID PANEL
Chol/HDL Ratio: 5.2 ratio — ABNORMAL HIGH (ref 0.0–4.4)
Cholesterol, Total: 222 mg/dL — ABNORMAL HIGH (ref 100–199)
HDL: 43 mg/dL (ref >39)
LDL Chol Calc (NIH): 146 mg/dL — ABNORMAL HIGH (ref 0–99)
Triglycerides: 184 mg/dL — ABNORMAL HIGH (ref 0–149)
VLDL Cholesterol Cal: 33 mg/dL (ref 5–40)

## 2020-04-01 LAB — CYTOLOGY - PAP: Diagnosis: NEGATIVE

## 2020-06-03 ENCOUNTER — Other Ambulatory Visit: Payer: Self-pay | Admitting: Family

## 2020-06-03 DIAGNOSIS — Z713 Dietary counseling and surveillance: Secondary | ICD-10-CM

## 2020-06-05 ENCOUNTER — Other Ambulatory Visit: Payer: Self-pay | Admitting: Family

## 2020-06-05 DIAGNOSIS — K219 Gastro-esophageal reflux disease without esophagitis: Secondary | ICD-10-CM

## 2020-06-20 ENCOUNTER — Ambulatory Visit: Payer: BC Managed Care – PPO | Admitting: Family

## 2020-06-22 ENCOUNTER — Encounter: Payer: Self-pay | Admitting: Family

## 2020-06-22 ENCOUNTER — Ambulatory Visit: Payer: BC Managed Care – PPO | Admitting: Family

## 2020-06-22 ENCOUNTER — Other Ambulatory Visit: Payer: Self-pay

## 2020-06-22 VITALS — BP 131/73 | HR 94 | Temp 98.5°F | Ht 63.0 in | Wt 139.2 lb

## 2020-06-22 DIAGNOSIS — L989 Disorder of the skin and subcutaneous tissue, unspecified: Secondary | ICD-10-CM | POA: Diagnosis not present

## 2020-06-22 DIAGNOSIS — E782 Mixed hyperlipidemia: Secondary | ICD-10-CM | POA: Diagnosis not present

## 2020-06-22 DIAGNOSIS — Z713 Dietary counseling and surveillance: Secondary | ICD-10-CM | POA: Diagnosis not present

## 2020-06-22 DIAGNOSIS — E559 Vitamin D deficiency, unspecified: Secondary | ICD-10-CM

## 2020-06-22 DIAGNOSIS — K219 Gastro-esophageal reflux disease without esophagitis: Secondary | ICD-10-CM

## 2020-06-22 DIAGNOSIS — F411 Generalized anxiety disorder: Secondary | ICD-10-CM | POA: Diagnosis not present

## 2020-06-22 DIAGNOSIS — J309 Allergic rhinitis, unspecified: Secondary | ICD-10-CM

## 2020-06-22 MED ORDER — VENLAFAXINE HCL ER 75 MG PO CP24
ORAL_CAPSULE | ORAL | 3 refills | Status: DC
Start: 2020-06-22 — End: 2021-01-26

## 2020-06-22 MED ORDER — OMEPRAZOLE 20 MG PO CPDR: DELAYED_RELEASE_CAPSULE | ORAL | 1 refills | Status: DC

## 2020-06-22 MED ORDER — PHENTERMINE HCL 37.5 MG PO TABS: 37.5000 mg | ORAL_TABLET | Freq: Every day | ORAL | 1 refills | Status: DC

## 2020-06-22 NOTE — Progress Notes (Signed)
 Subjective:    Patient ID: Kathryn Singh, female    DOB: 05/27/1975, 45 y.o.   MRN: [id number]  Chief Complaint  Patient presents with  . Medical Management of Chronic Issues  . Eczema    ON RIGHT ARM ARM BEEN THERE FOR OVER A YEAR ITCHES HAS GOTTEN BIGGER OVER TIMES COMES AND GOES    Pt presents to the office today for chronic follow up.   She is complaining of a skin lesion on right forearm that has been there for a year on and off. Denies any pain.  Gastroesophageal Reflux She complains of belching and heartburn. This is a chronic problem. The current episode started more than 1 year ago. The problem occurs occasionally. The problem has been waxing and waning. She has tried a PPI for the symptoms. The treatment provided moderate relief.  Hyperlipidemia This is a chronic problem. The current episode started more than 1 year ago. Current antihyperlipidemic treatment includes diet change. The current treatment provides mild improvement of lipids. Risk factors for coronary artery disease include dyslipidemia.  Anxiety Presents for follow-up visit. Symptoms include depressed mood, excessive worry, irritability, nervous/anxious behavior and restlessness. Symptoms occur occasionally. The severity of symptoms is moderate.        Review of Systems  Constitutional: Positive for irritability.  Gastrointestinal: Positive for heartburn.  Psychiatric/Behavioral: The patient is nervous/anxious.   All other systems reviewed and are negative.      Objective:   Physical Exam Vitals reviewed.  Constitutional:      General: She is not in acute distress.    Appearance: She is well-developed.  HENT:     Head: Normocephalic and atraumatic.     Right Ear: Tympanic membrane normal.     Left Ear: Tympanic membrane normal.  Eyes:     Pupils: Pupils are equal, round, and reactive to light.  Neck:     Thyroid: No thyromegaly.  Cardiovascular:     Rate and Rhythm: Normal rate and regular  rhythm.     Heart sounds: Normal heart sounds. No murmur heard.   Pulmonary:     Effort: Pulmonary effort is normal. No respiratory distress.     Breath sounds: Normal breath sounds. No wheezing.  Abdominal:     General: Bowel sounds are normal. There is no distension.     Palpations: Abdomen is soft.     Tenderness: There is no abdominal tenderness.  Musculoskeletal:        General: No tenderness. Normal range of motion.     Cervical back: Normal range of motion and neck supple.  Skin:    General: Skin is warm and dry.          Comments: Small wart like skin lesion on right forearm  Neurological:     Mental Status: She is alert and oriented to person, place, and time.     Cranial Nerves: No cranial nerve deficit.     Deep Tendon Reflexes: Reflexes are normal and symmetric.  Psychiatric:        Behavior: Behavior normal.        Thought Content: Thought content normal.        Judgment: Judgment normal.    cryotherapy used on lesion on right forearm. Pt tolerated well.    BP 131/73   Pulse 94   Temp 98.5 F (36.9 C) (Temporal)   Ht 5' 3" (1.6 m)   Wt 139 lb 3.2 oz (63.1 kg)   BMI 24.66 kg/m        Assessment & Plan:  Kathryn Singh comes in today with chief complaint of Medical Management of Chronic Issues and Eczema (ON RIGHT ARM ARM BEEN THERE FOR OVER A YEAR ITCHES HAS GOTTEN BIGGER OVER TIMES COMES AND GOES )   Diagnosis and orders addressed:  1. GAD (generalized anxiety disorder) - venlafaxine XR (EFFEXOR-XR) 75 MG 24 hr capsule; TAKE 1 CAPSULE BY MOUTH ONCE DAILY WITH BREAKFAST  Dispense: 90 capsule; Refill: 3 - CMP14+EGFR  2. Weight loss counseling, encounter for - phentermine (ADIPEX-P) 37.5 MG tablet; Take 1 tablet (37.5 mg total) by mouth daily before breakfast.  Dispense: 30 tablet; Refill: 1 - CMP14+EGFR  3. Gastroesophageal reflux disease without esophagitis - omeprazole (PRILOSEC) 20 MG capsule; TAKE 1 CAPSULE BY MOUTH EVERY DAY  Dispense: 90 capsule;  Refill: 1 - CMP14+EGFR  4. Mixed hyperlipidemia  - CMP14+EGFR  5. Vitamin D deficiency - CMP14+EGFR  6. Chronic allergic rhinitis - CMP14+EGFR  7. Skin lesion Do not pick at   Labs pending Health Maintenance reviewed Diet and exercise encouraged  Follow up plan: 6 months   Christy Hawks, FNP  

## 2020-06-22 NOTE — Patient Instructions (Signed)
[employer] Current Therapy 2021 (pp. 213-216). [place], [place]: Elsevier.">  Gastroesophageal Reflux Disease, Adult Gastroesophageal reflux (GER) happens when acid from the stomach flows up into the tube that connects the mouth and the stomach (esophagus). Normally, food travels down the esophagus and stays in the stomach to be digested. However, when a person has GER, food and stomach acid sometimes move back up into the esophagus. If this becomes a more serious problem, the person may be diagnosed with a disease called gastroesophageal reflux disease (GERD). GERD occurs when the reflux:  Happens often.  Causes frequent or severe symptoms.  Causes problems such as damage to the esophagus. When stomach acid comes in contact with the esophagus, the acid may cause inflammation in the esophagus. Over time, GERD may create small holes (ulcers) in the lining of the esophagus. What are the causes? This condition is caused by a problem with the muscle between the esophagus and the stomach (lower esophageal sphincter, or LES). Normally, the LES muscle closes after food passes through the esophagus to the stomach. When the LES is weakened or abnormal, it does not close properly, and that allows food and stomach acid to go back up into the esophagus. The LES can be weakened by certain dietary substances, medicines, and medical conditions, including:  Tobacco use.  Pregnancy.  Having a hiatal hernia.  Alcohol use.  Certain foods and beverages, such as coffee, chocolate, onions, and peppermint. What increases the risk? You are more likely to develop this condition if you:  Have an increased body weight.  Have a connective tissue disorder.  Take NSAIDs, such as ibuprofen. What are the signs or symptoms? Symptoms of this condition include:  Heartburn.  Difficult or painful swallowing and the feeling of having a lump in the throat.  A bitter taste in the mouth.  Bad breath and having a large  amount of saliva.  Having an upset or bloated stomach and belching.  Chest pain. Different conditions can cause chest pain. Make sure you see your health care provider if you experience chest pain.  Shortness of breath or wheezing.  Ongoing (chronic) cough or a nighttime cough.  Wearing away of tooth enamel.  Weight loss. How is this diagnosed? This condition may be diagnosed based on a medical history and a physical exam. To determine if you have mild or severe GERD, your health care provider may also monitor how you respond to treatment. You may also have tests, including:  A test to examine your stomach and esophagus with a small camera (endoscopy).  A test that measures the acidity level in your esophagus.  A test that measures how much pressure is on your esophagus.  A barium swallow or modified barium swallow test to show the shape, size, and functioning of your esophagus. How is this treated? Treatment for this condition may vary depending on how severe your symptoms are. Your health care provider may recommend:  Changes to your diet.  Medicine.  Surgery. The goal of treatment is to help relieve your symptoms and to prevent complications. Follow these instructions at home: Eating and drinking  Follow a diet as recommended by your health care provider. This may involve avoiding foods and drinks such as: ? Coffee and tea, with or without caffeine. ? Drinks that contain alcohol. ? Energy drinks and sports drinks. ? Carbonated drinks or sodas. ? Chocolate and cocoa. ? Peppermint and mint flavorings. ? Garlic and onions. ? Horseradish. ? Spicy and acidic foods, including peppers, chili powder,   curry powder, vinegar, hot sauces, and barbecue sauce. ? Citrus fruit juices and citrus fruits, such as oranges, lemons, and limes. ? Tomato-based foods, such as red sauce, chili, salsa, and pizza with red sauce. ? Fried and fatty foods, such as donuts, french fries, potato  chips, and high-fat dressings. ? High-fat meats, such as hot dogs and fatty cuts of red and [place] meats, such as rib eye steak, sausage, ham, and bacon. ? High-fat dairy items, such as whole milk, butter, and cream cheese.  Eat small, frequent meals instead of large meals.  Avoid drinking large amounts of liquid with your meals.  Avoid eating meals during the 2-3 hours before bedtime.  Avoid lying down right after you eat.  Do not exercise right after you eat.   Lifestyle  Do not use any products that contain nicotine or tobacco. These products include cigarettes, chewing tobacco, and vaping devices, such as e-cigarettes. If you need help quitting, ask your health care provider.  Try to reduce your stress by using methods such as yoga or meditation. If you need help reducing stress, ask your health care provider.  If you are overweight, reduce your weight to an amount that is healthy for you. Ask your health care provider for guidance about a safe weight loss goal.   General instructions  Pay attention to any changes in your symptoms.  Take over-the-counter and prescription medicines only as told by your health care provider. Do not take aspirin, ibuprofen, or other NSAIDs unless your health care provider told you to take these medicines.  Wear loose-fitting clothing. Do not wear anything tight around your waist that causes pressure on your abdomen.  Raise (elevate) the head of your bed about 6 inches (15 cm). You can use a wedge to do this.  Avoid bending over if this makes your symptoms worse.  Keep all follow-up visits. This is important. Contact a health care provider if:  You have: ? New symptoms. ? Unexplained weight loss. ? Difficulty swallowing or it hurts to swallow. ? Wheezing or a persistent cough. ? A hoarse voice.  Your symptoms do not improve with treatment. Get help right away if:  You have sudden pain in your arms, neck, jaw, teeth, or back.  You  suddenly feel sweaty, dizzy, or light-headed.  You have chest pain or shortness of breath.  You vomit and the vomit is green, yellow, or black, or it looks like blood or coffee grounds.  You faint.  You have stool that is red, bloody, or black.  You cannot swallow, drink, or eat. These symptoms may represent a serious problem that is an emergency. Do not wait to see if the symptoms will go away. Get medical help right away. Call your local emergency services (911 in the [place] Do not drive yourself to the hospital. Summary  Gastroesophageal reflux happens when acid from the stomach flows up into the esophagus. GERD is a disease in which the reflux happens often, causes frequent or severe symptoms, or causes problems such as damage to the esophagus.  Treatment for this condition may vary depending on how severe your symptoms are. Your health care provider may recommend diet and lifestyle changes, medicine, or surgery.  Contact a health care provider if you have new or worsening symptoms.  Take over-the-counter and prescription medicines only as told by your health care provider. Do not take aspirin, ibuprofen, or other NSAIDs unless your health care provider told you to do so.  Keep all follow-up   visits as told by your health care provider. This is important. This information is not intended to replace advice given to you by your health care provider. Make sure you discuss any questions you have with your health care provider. Document Revised: 09/14/2019 Document Reviewed: 09/14/2019 Elsevier Patient Education  2021 Elsevier Inc.  

## 2020-06-23 LAB — CMP14+EGFR
ALT: 28 IU/L (ref 0–32)
AST: 23 IU/L (ref 0–40)
Albumin/Globulin Ratio: 1.4 (ref 1.2–2.2)
Albumin: 4.3 g/dL (ref 3.8–4.8)
Alkaline Phosphatase: 85 IU/L (ref 44–121)
BUN/Creatinine Ratio: 20 (ref 9–23)
BUN: 15 mg/dL (ref 6–24)
Bilirubin Total: 0.3 mg/dL (ref 0.0–1.2)
CO2: 20 mmol/L (ref 20–29)
Calcium: 9 mg/dL (ref 8.7–10.2)
Chloride: 103 mmol/L (ref 96–106)
Creatinine, Ser: 0.76 mg/dL (ref 0.57–1.00)
Globulin, Total: 3 g/dL (ref 1.5–4.5)
Glucose: 89 mg/dL (ref 65–99)
Potassium: 3.8 mmol/L (ref 3.5–5.2)
Sodium: 139 mmol/L (ref 134–144)
Total Protein: 7.3 g/dL (ref 6.0–8.5)
eGFR: 98 mL/min/{1.73_m2} (ref >59)

## 2020-07-11 ENCOUNTER — Ambulatory Visit: Payer: BC Managed Care – PPO | Admitting: Family

## 2020-08-29 ENCOUNTER — Other Ambulatory Visit: Payer: Self-pay | Admitting: Family

## 2020-08-29 DIAGNOSIS — Z713 Dietary counseling and surveillance: Secondary | ICD-10-CM

## 2020-09-26 ENCOUNTER — Telehealth: Payer: Self-pay | Admitting: Family

## 2020-09-26 NOTE — Telephone Encounter (Signed)
Patient aware and verbalized understanding. °

## 2020-09-26 NOTE — Telephone Encounter (Signed)
I recommend her buying OTC or using Goodrx. It is only $35 at [facility] and $15 at [facility].

## 2020-10-04 ENCOUNTER — Other Ambulatory Visit: Payer: Self-pay | Admitting: Family

## 2020-10-04 DIAGNOSIS — Z713 Dietary counseling and surveillance: Secondary | ICD-10-CM

## 2020-12-27 ENCOUNTER — Other Ambulatory Visit: Payer: Self-pay | Admitting: Family

## 2020-12-27 DIAGNOSIS — Z713 Dietary counseling and surveillance: Secondary | ICD-10-CM

## 2021-01-10 ENCOUNTER — Ambulatory Visit: Payer: BC Managed Care – PPO | Admitting: Family Medicine

## 2021-01-10 ENCOUNTER — Encounter: Payer: Self-pay | Admitting: Family Medicine

## 2021-01-10 DIAGNOSIS — H66009 Acute suppurative otitis media without spontaneous rupture of ear drum, unspecified ear: Secondary | ICD-10-CM | POA: Diagnosis not present

## 2021-01-10 MED ORDER — AMOXICILLIN-POT CLAVULANATE 875-125 MG PO TABS: 1.0000 | ORAL_TABLET | Freq: Two times a day (BID) | ORAL | 0 refills | Status: DC

## 2021-01-10 NOTE — Progress Notes (Signed)
Subjective:    Patient ID: LEANNY MOECKEL, female    DOB: [birth year], 45 y.o.   MRN: [id number]   HPI:   Patient presents today for Patient presents with upper respiratory congestion. Rhinorrhea that is frequently purulent. Left ear pain. HA at left forehead. There is moderate sore throat. Patient denies cough. She had a fever at onset for one day, subjective. Denies chills or sweats. The patient denies being short of breath. Onset was 5 days ago. Gradually worsening. Tried OTCs without improvement.   Depression screen [facility] 2/9 06/22/2020 03/25/2020 12/18/2019 07/16/2017 05/11/2017  Decreased Interest 0 0 0 0 0  Down, Depressed, Hopeless 0 0 0 0 0  PHQ - 2 Score 0 0 0 0 0  Altered sleeping - - 0 - -  Tired, decreased energy - - 1 - -  Change in appetite - - 0 - -  Feeling bad or failure about yourself  - - 0 - -  Trouble concentrating - - 0 - -  Moving slowly or fidgety/restless - - 0 - -  Suicidal thoughts - - 0 - -  PHQ-9 Score - - 1 - -  Difficult doing work/chores - - Not difficult at all - -     Relevant past medical, surgical, family and social history reviewed and updated as indicated.  Interim medical history since our last visit reviewed. Allergies and medications reviewed and updated.  ROS:  Review of Systems  Constitutional:  Positive for fever. Negative for activity change, appetite change and chills.  HENT:  Positive for congestion, ear pain, hearing loss, postnasal drip, rhinorrhea and sinus pressure. Negative for ear discharge, nosebleeds, sneezing and trouble swallowing.   Respiratory:  Negative for cough, chest tightness and shortness of breath.   Cardiovascular:  Negative for chest pain and palpitations.  Skin:  Negative for rash.    Social History   Tobacco Use  Smoking Status Never  Smokeless Tobacco Never       Objective:     Wt Readings from Last 3 Encounters:  06/22/20 139 lb 3.2 oz (63.1 kg)  03/25/20 142 lb 12.8 oz (64.8 kg)  12/18/19 149 lb  (67.6 kg)     Exam deferred. Pt. Harboring due to COVID 19. Phone visit performed.   Assessment & Plan:   1. Acute suppurative otitis media without spontaneous rupture of ear drum, recurrence not specified, unspecified laterality     Meds ordered this encounter  Medications   amoxicillin-clavulanate (AUGMENTIN) 875-125 MG tablet    Sig: Take 1 tablet by mouth 2 (two) times daily. Take all of this medication    Dispense:  20 tablet    Refill:  0    No orders of the defined types were placed in this encounter.     Diagnoses and all orders for this visit:  Acute suppurative otitis media without spontaneous rupture of ear drum, recurrence not specified, unspecified laterality  Other orders -     amoxicillin-clavulanate (AUGMENTIN) 875-125 MG tablet; Take 1 tablet by mouth 2 (two) times daily. Take all of this medication   Virtual Visit via telephone Note  I discussed the limitations, risks, security and privacy concerns of performing an evaluation and management service by telephone and the availability of in person appointments. The patient was identified with two identifiers. Pt.expressed understanding and agreed to proceed. Pt. Is at home. Dr. Darlyn Read is in his office.  Follow Up Instructions:   I discussed the assessment and treatment plan  with the patient. The patient was provided an opportunity to ask questions and all were answered. The patient agreed with the plan and demonstrated an understanding of the instructions.   The patient was advised to call back or seek an in-person evaluation if the symptoms worsen or if the condition fails to improve as anticipated.   Total minutes including chart review and phone contact time: 7   Follow up plan: Return if symptoms worsen or fail to improve.  Mechele Claude, MD [place] [facility]

## 2021-01-26 ENCOUNTER — Ambulatory Visit: Payer: BC Managed Care – PPO | Admitting: Family

## 2021-01-26 ENCOUNTER — Other Ambulatory Visit: Payer: Self-pay

## 2021-01-26 ENCOUNTER — Encounter: Payer: Self-pay | Admitting: Family

## 2021-01-26 VITALS — BP 121/75 | HR 95 | Temp 99.5°F | Ht 63.0 in | Wt 150.8 lb

## 2021-01-26 DIAGNOSIS — E782 Mixed hyperlipidemia: Secondary | ICD-10-CM

## 2021-01-26 DIAGNOSIS — K219 Gastro-esophageal reflux disease without esophagitis: Secondary | ICD-10-CM

## 2021-01-26 DIAGNOSIS — F411 Generalized anxiety disorder: Secondary | ICD-10-CM

## 2021-01-26 DIAGNOSIS — E559 Vitamin D deficiency, unspecified: Secondary | ICD-10-CM | POA: Diagnosis not present

## 2021-01-26 MED ORDER — DESVENLAFAXINE SUCCINATE ER 50 MG PO TB24: ORAL_TABLET | ORAL | 0 refills | Status: DC

## 2021-01-26 MED ORDER — BUSPIRONE HCL 5 MG PO TABS: 5.0000 mg | ORAL_TABLET | Freq: Three times a day (TID) | ORAL | 1 refills | Status: DC | PRN

## 2021-01-26 MED ORDER — DESVENLAFAXINE SUCCINATE ER 100 MG PO TB24: 100.0000 mg | ORAL_TABLET | Freq: Every day | ORAL | 3 refills | Status: DC

## 2021-01-26 NOTE — Patient Instructions (Signed)

## 2021-01-26 NOTE — Progress Notes (Signed)
Subjective:    Patient ID: Kathryn Singh, female    DOB: 03-13-1976, 45 y.o.   MRN: [id number]  Chief Complaint  Patient presents with   Medication Problem   Pt presents to the office today for chronic follow up. Her husband was recently diagnosed with a brain tumor and this has a caused a great deal of stress for her. Gastroesophageal Reflux She complains of belching and heartburn. This is a chronic problem. The current episode started more than 1 year ago. The problem occurs occasionally. She has tried a PPI for the symptoms. The treatment provided moderate relief.  Hyperlipidemia This is a chronic problem. The current episode started more than 1 year ago. Exacerbating diseases include obesity. Current antihyperlipidemic treatment includes statins. The current treatment provides moderate improvement of lipids. Risk factors for coronary artery disease include dyslipidemia, diabetes mellitus, hypertension and a sedentary lifestyle.  Anxiety Presents for follow-up visit. Symptoms include excessive worry, irritability, nervous/anxious behavior and restlessness. Symptoms occur most days. The severity of symptoms is severe. The quality of sleep is good.       Review of Systems  Constitutional:  Positive for irritability.  Gastrointestinal:  Positive for heartburn.  Psychiatric/Behavioral:  The patient is nervous/anxious.   All other systems reviewed and are negative.     Objective:   Physical Exam Vitals reviewed.  Constitutional:      General: She is not in acute distress.    Appearance: She is well-developed.  HENT:     Head: Normocephalic and atraumatic.     Right Ear: Tympanic membrane normal.     Left Ear: Tympanic membrane normal.  Eyes:     Pupils: Pupils are equal, round, and reactive to light.  Neck:     Thyroid: No thyromegaly.  Cardiovascular:     Rate and Rhythm: Normal rate and regular rhythm.     Heart sounds: Normal heart sounds. No murmur heard. Pulmonary:      Effort: Pulmonary effort is normal. No respiratory distress.     Breath sounds: Normal breath sounds. No wheezing.  Abdominal:     General: Bowel sounds are normal. There is no distension.     Palpations: Abdomen is soft.     Tenderness: There is no abdominal tenderness.  Musculoskeletal:        General: No tenderness. Normal range of motion.     Cervical back: Normal range of motion and neck supple.  Skin:    General: Skin is warm and dry.  Neurological:     Mental Status: She is alert and oriented to person, place, and time.     Cranial Nerves: No cranial nerve deficit.     Deep Tendon Reflexes: Reflexes are normal and symmetric.  Psychiatric:        Mood and Affect: Affect is tearful.        Behavior: Behavior normal.        Thought Content: Thought content normal.        Judgment: Judgment normal.         BP 121/75   Pulse 95   Temp 99.5 F (37.5 C) (Temporal)   Ht 5\' 3"  (1.6 m)   Wt 150 lb 12.8 oz (68.4 kg)   BMI 26.71 kg/m   Assessment & Plan:  Kathryn Singh comes in today with chief complaint of Medication Problem   Diagnosis and orders addressed:  1. Gastroesophageal reflux disease without esophagitis  2. GAD (generalized anxiety disorder) Start Pristiq 50 mg  for two weeks then increase to 100 mg  Stress management  Buspar as needed RTO as needed - desvenlafaxine (PRISTIQ) 50 MG 24 hr tablet; Take 1 tablet (50 mg total) by mouth daily for 14 days, THEN 2 tablets (100 mg total) daily for 14 days.  Dispense: 42 tablet; Refill: 0 - desvenlafaxine (PRISTIQ) 100 MG 24 hr tablet; Take 1 tablet (100 mg total) by mouth daily.  Dispense: 90 tablet; Refill: 3 - busPIRone (BUSPAR) 5 MG tablet; Take 1 tablet (5 mg total) by mouth 3 (three) times daily as needed.  Dispense: 90 tablet; Refill: 1  3. Mixed hyperlipidemia  4. Vitamin D deficiency    Health Maintenance reviewed Diet and exercise encouraged  Follow up plan: 6 months    Jannifer Rodney, FNP

## 2021-02-18 ENCOUNTER — Other Ambulatory Visit: Payer: Self-pay | Admitting: Family

## 2021-02-18 DIAGNOSIS — F411 Generalized anxiety disorder: Secondary | ICD-10-CM

## 2021-05-04 ENCOUNTER — Other Ambulatory Visit: Payer: Self-pay | Admitting: Family

## 2021-05-04 DIAGNOSIS — Z713 Dietary counseling and surveillance: Secondary | ICD-10-CM

## 2021-05-27 ENCOUNTER — Other Ambulatory Visit: Payer: Self-pay | Admitting: Family

## 2021-05-27 DIAGNOSIS — F411 Generalized anxiety disorder: Secondary | ICD-10-CM

## 2021-06-06 ENCOUNTER — Encounter: Payer: Self-pay | Admitting: Family

## 2021-06-06 ENCOUNTER — Ambulatory Visit (INDEPENDENT_AMBULATORY_CARE_PROVIDER_SITE_OTHER): Payer: BC Managed Care – PPO | Admitting: Family

## 2021-06-06 VITALS — BP 111/72 | HR 91 | Temp 98.0°F | Ht 63.0 in | Wt 146.0 lb

## 2021-06-06 DIAGNOSIS — Z713 Dietary counseling and surveillance: Secondary | ICD-10-CM

## 2021-06-06 DIAGNOSIS — K219 Gastro-esophageal reflux disease without esophagitis: Secondary | ICD-10-CM

## 2021-06-06 DIAGNOSIS — F411 Generalized anxiety disorder: Secondary | ICD-10-CM | POA: Diagnosis not present

## 2021-06-06 DIAGNOSIS — E782 Mixed hyperlipidemia: Secondary | ICD-10-CM | POA: Diagnosis not present

## 2021-06-06 DIAGNOSIS — Z0000 Encounter for general adult medical examination without abnormal findings: Secondary | ICD-10-CM

## 2021-06-06 DIAGNOSIS — E559 Vitamin D deficiency, unspecified: Secondary | ICD-10-CM

## 2021-06-06 DIAGNOSIS — Z0001 Encounter for general adult medical examination with abnormal findings: Secondary | ICD-10-CM

## 2021-06-06 MED ORDER — PHENTERMINE HCL 37.5 MG PO TABS: 37.5000 mg | ORAL_TABLET | Freq: Every day | ORAL | 2 refills | Status: AC

## 2021-06-06 MED ORDER — BUSPIRONE HCL 5 MG PO TABS: 5.0000 mg | ORAL_TABLET | Freq: Three times a day (TID) | ORAL | 0 refills | Status: AC | PRN

## 2021-06-06 MED ORDER — DESVENLAFAXINE SUCCINATE ER 50 MG PO TB24: 50.0000 mg | ORAL_TABLET | Freq: Every day | ORAL | 1 refills | Status: AC

## 2021-06-06 NOTE — Progress Notes (Signed)
? ?Subjective:  ? ? Patient ID: Kathryn Singh, female    DOB: 09-29-1975, 46 y.o.   MRN: [id number] ? ?Chief Complaint  ?Patient presents with  ? Annual Exam  ? ?PT presents to the office today for CPE without pap. Her husband is currently doing chemo for brain tumor. This has caused her a lot of stress and anxiety.  ?Gastroesophageal Reflux ?She complains of belching and heartburn. This is a chronic problem. The current episode started more than 1 year ago. The problem occurs occasionally. The problem has been waxing and waning. The symptoms are aggravated by certain foods. The treatment provided moderate relief.  ?Hyperlipidemia ?This is a chronic problem. The current episode started more than 1 year ago. The problem is uncontrolled. Exacerbating diseases include obesity. Current antihyperlipidemic treatment includes diet change. The current treatment provides moderate improvement of lipids. Risk factors for coronary artery disease include dyslipidemia, hypertension and a sedentary lifestyle.  ?Anxiety ?Presents for follow-up visit. Symptoms include depressed mood, excessive worry, irritability, nervous/anxious behavior and restlessness. Symptoms occur occasionally. The severity of symptoms is moderate. The quality of sleep is good.  ? ? ? ? ? ?Review of Systems  ?Constitutional:  Positive for irritability.  ?Gastrointestinal:  Positive for heartburn.  ?Psychiatric/Behavioral:  The patient is nervous/anxious.   ?All other systems reviewed and are negative. ? ?   ?Objective:  ? Physical Exam ?Vitals reviewed.  ?Constitutional:   ?   General: She is not in acute distress. ?   Appearance: She is well-developed. She is obese.  ?HENT:  ?   Head: Normocephalic and atraumatic.  ?   Right Ear: Tympanic membrane normal.  ?   Left Ear: Tympanic membrane normal.  ?Eyes:  ?   Pupils: Pupils are equal, round, and reactive to light.  ?Neck:  ?   Thyroid: No thyromegaly.  ?Cardiovascular:  ?   Rate and Rhythm: Normal rate and  regular rhythm.  ?   Heart sounds: Normal heart sounds. No murmur heard. ?Pulmonary:  ?   Effort: Pulmonary effort is normal. No respiratory distress.  ?   Breath sounds: Normal breath sounds. No wheezing.  ?Abdominal:  ?   General: Bowel sounds are normal. There is no distension.  ?   Palpations: Abdomen is soft.  ?   Tenderness: There is no abdominal tenderness.  ?Musculoskeletal:     ?   General: No tenderness. Normal range of motion.  ?   Cervical back: Normal range of motion and neck supple.  ?Skin: ?   General: Skin is warm and dry.  ?Neurological:  ?   Mental Status: She is alert and oriented to person, place, and time.  ?   Cranial Nerves: No cranial nerve deficit.  ?   Deep Tendon Reflexes: Reflexes are normal and symmetric.  ?Psychiatric:     ?   Behavior: Behavior normal.     ?   Thought Content: Thought content normal.     ?   Judgment: Judgment normal.  ? ? ?BP 111/72   Pulse 91   Temp 98 ?F (36.7 ?C) (Temporal)   Ht '5\' 3"'  (1.6 m)   Wt 146 lb (66.2 kg)   BMI 25.86 kg/m?  ? ?   ?Assessment & Plan:  ?Kathryn Singh comes in today with chief complaint of Annual Exam ? ? ?Diagnosis and orders addressed: ? ?1. Annual physical exam ?- CMP14+EGFR ?- CBC with Differential/Platelet ?- Lipid panel ?- TSH ? ?2. GAD (generalized anxiety disorder) ?-  desvenlafaxine (PRISTIQ) 50 MG 24 hr tablet; Take 1 tablet (50 mg total) by mouth daily.  Dispense: 90 tablet; Refill: 1 ?- busPIRone (BUSPAR) 5 MG tablet; Take 1 tablet (5 mg total) by mouth 3 (three) times daily as needed.  Dispense: 270 tablet; Refill: 0 ?- CMP14+EGFR ?- CBC with Differential/Platelet ? ?3. Gastroesophageal reflux disease without esophagitis ?- CMP14+EGFR ?- CBC with Differential/Platelet ? ?4. Mixed hyperlipidemia ?- CMP14+EGFR ?- CBC with Differential/Platelet ? ?5. Vitamin D deficiency ?- CMP14+EGFR ?- CBC with Differential/Platelet ? ?6. Weight loss counseling, encounter for ?- phentermine (ADIPEX-P) 37.5 MG tablet; Take 1 tablet (37.5 mg  total) by mouth daily before breakfast.  Dispense: 30 tablet; Refill: 2 ?- CMP14+EGFR ?- CBC with Differential/Platelet ? ? ?Labs pending ?Health Maintenance reviewed ?Diet and exercise encouraged ? ?Follow up plan: ?3 months ? ?Evelina Dun, FNP ? ? ? ?

## 2021-06-06 NOTE — Patient Instructions (Signed)

## 2021-06-13 ENCOUNTER — Other Ambulatory Visit: Payer: Self-pay | Admitting: Family

## 2021-06-13 DIAGNOSIS — Z1231 Encounter for screening mammogram for malignant neoplasm of breast: Secondary | ICD-10-CM

## 2021-07-10 ENCOUNTER — Other Ambulatory Visit: Payer: Self-pay | Admitting: Family

## 2021-07-10 DIAGNOSIS — Z713 Dietary counseling and surveillance: Secondary | ICD-10-CM

## 2021-07-19 ENCOUNTER — Ambulatory Visit
Admission: RE | Admit: 2021-07-19 | Discharge: 2021-07-19 | Disposition: A | Discharge status: Home / Self Care | Payer: BC Managed Care – PPO | Source: Ambulatory Visit | Attending: Family | Admitting: Family

## 2021-07-19 DIAGNOSIS — Z1231 Encounter for screening mammogram for malignant neoplasm of breast: Secondary | ICD-10-CM

## 2021-07-25 ENCOUNTER — Other Ambulatory Visit: Payer: Self-pay | Admitting: Family

## 2021-07-25 DIAGNOSIS — Z713 Dietary counseling and surveillance: Secondary | ICD-10-CM

## 2021-08-07 ENCOUNTER — Encounter: Payer: Self-pay | Admitting: Family

## 2021-08-07 ENCOUNTER — Ambulatory Visit: Payer: BC Managed Care – PPO | Admitting: Family

## 2021-08-09 DIAGNOSIS — R3915 Urgency of urination: Secondary | ICD-10-CM | POA: Diagnosis not present

## 2021-08-09 DIAGNOSIS — Z6829 Body mass index (BMI) 29.0-29.9, adult: Secondary | ICD-10-CM | POA: Diagnosis not present

## 2021-08-11 ENCOUNTER — Telehealth: Payer: Self-pay | Admitting: Family

## 2021-08-11 NOTE — Telephone Encounter (Signed)
Ready for pick up

## 2021-09-04 DIAGNOSIS — R635 Abnormal weight gain: Secondary | ICD-10-CM | POA: Diagnosis not present

## 2021-09-04 DIAGNOSIS — F329 Major depressive disorder, single episode, unspecified: Secondary | ICD-10-CM | POA: Diagnosis not present

## 2021-09-04 DIAGNOSIS — N390 Urinary tract infection, site not specified: Secondary | ICD-10-CM | POA: Diagnosis not present

## 2021-10-18 DIAGNOSIS — F329 Major depressive disorder, single episode, unspecified: Secondary | ICD-10-CM | POA: Diagnosis not present

## 2021-10-18 DIAGNOSIS — R638 Other symptoms and signs concerning food and fluid intake: Secondary | ICD-10-CM | POA: Diagnosis not present

## 2021-10-18 DIAGNOSIS — D509 Iron deficiency anemia, unspecified: Secondary | ICD-10-CM | POA: Diagnosis not present

## 2021-12-08 ENCOUNTER — Ambulatory Visit: Payer: BC Managed Care – PPO | Admitting: Family

## 2021-12-11 ENCOUNTER — Encounter: Payer: Self-pay | Admitting: Family

## 2021-12-25 DIAGNOSIS — D649 Anemia, unspecified: Secondary | ICD-10-CM | POA: Diagnosis not present

## 2021-12-25 DIAGNOSIS — R739 Hyperglycemia, unspecified: Secondary | ICD-10-CM | POA: Diagnosis not present

## 2021-12-25 DIAGNOSIS — F329 Major depressive disorder, single episode, unspecified: Secondary | ICD-10-CM | POA: Diagnosis not present

## 2021-12-25 DIAGNOSIS — E785 Hyperlipidemia, unspecified: Secondary | ICD-10-CM | POA: Diagnosis not present

## 2021-12-25 DIAGNOSIS — R635 Abnormal weight gain: Secondary | ICD-10-CM | POA: Diagnosis not present

## 2022-01-28 ENCOUNTER — Other Ambulatory Visit: Payer: Self-pay | Admitting: Family Medicine

## 2022-06-18 IMAGING — MG MM DIGITAL SCREENING BILAT W/ TOMO AND CAD
8 series · 8 of 24 positions shown · non-contrast
Comparison: None.

CLINICAL DATA: Screening.

EXAM:
DIGITAL SCREENING BILATERAL MAMMOGRAM WITH TOMOSYNTHESIS AND CAD
TECHNIQUE: Bilateral screening digital craniocaudal and mediolateral oblique
mammograms were obtained. Bilateral screening digital breast
tomosynthesis was performed. The images were evaluated with
computer-aided detection.

[L MLO synth-2D]
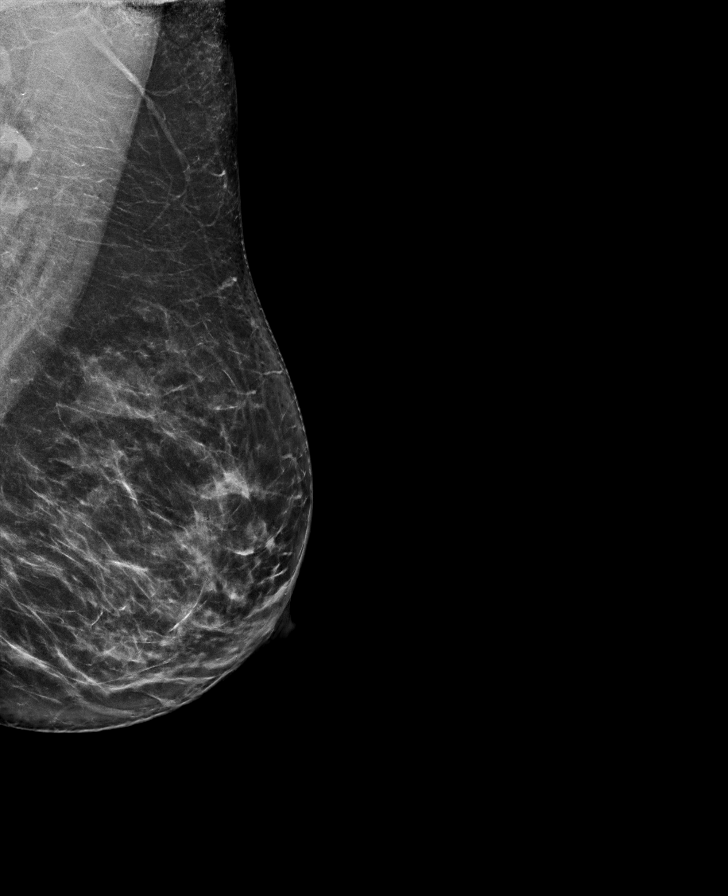

[R MLO synth-2D]
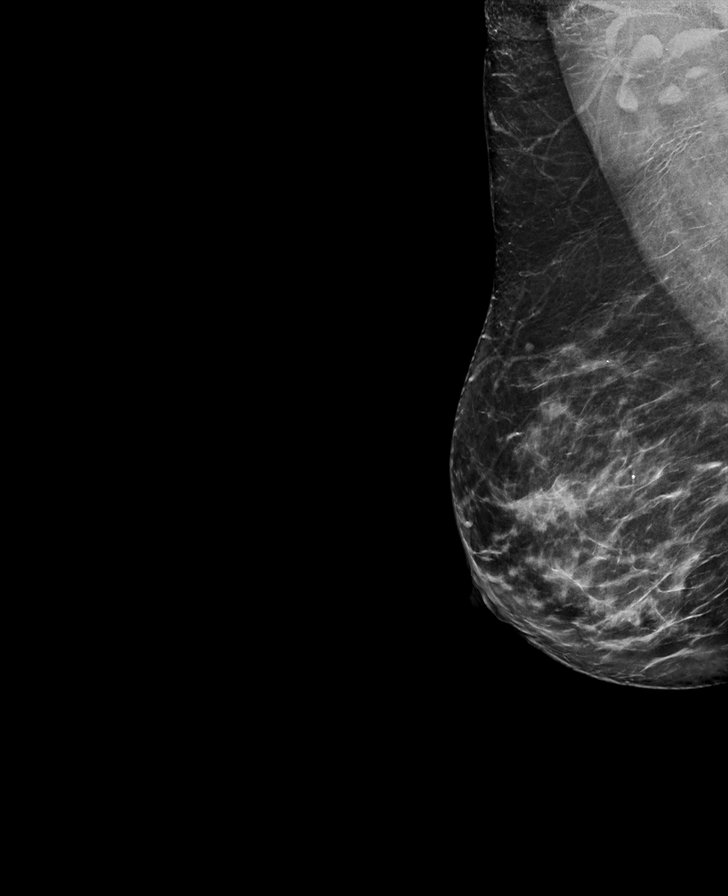

[L CC synth-2D]
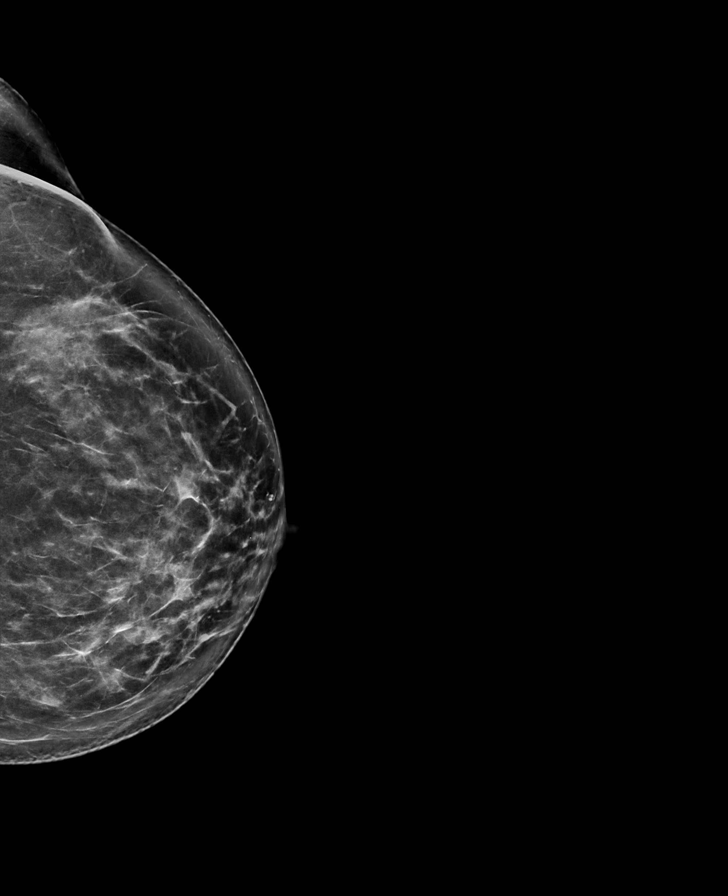

[R CC synth-2D]
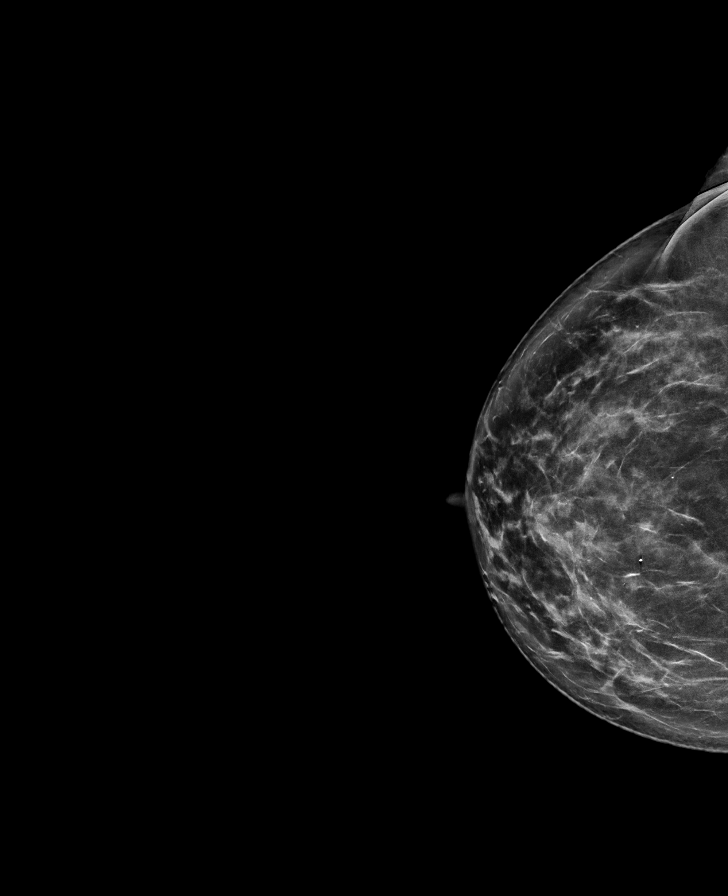

[L MLO tomo · tomo slice 41/82.0]
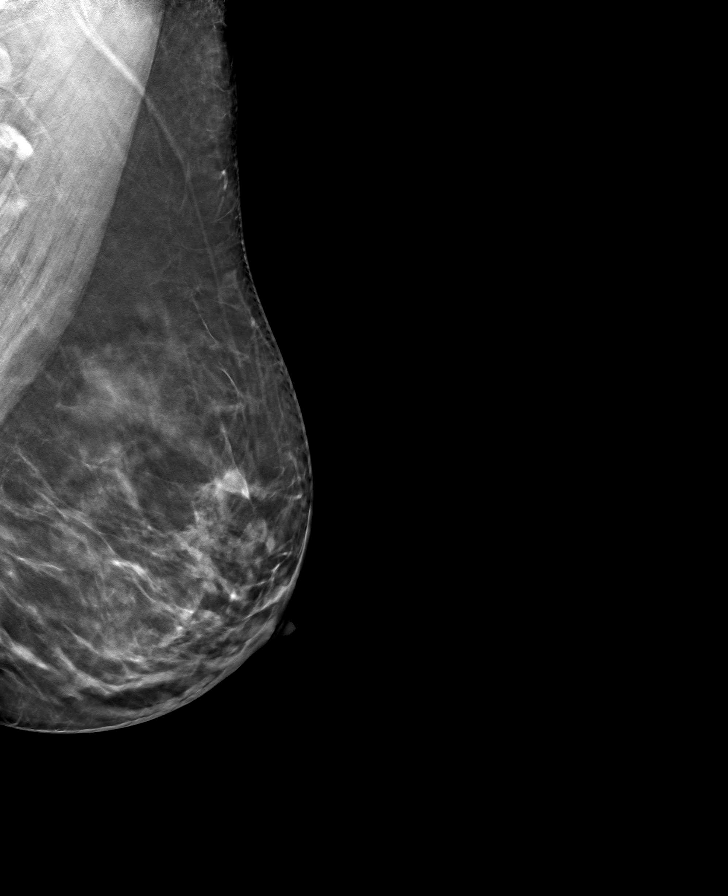

[R MLO tomo · tomo slice 43/85.0]
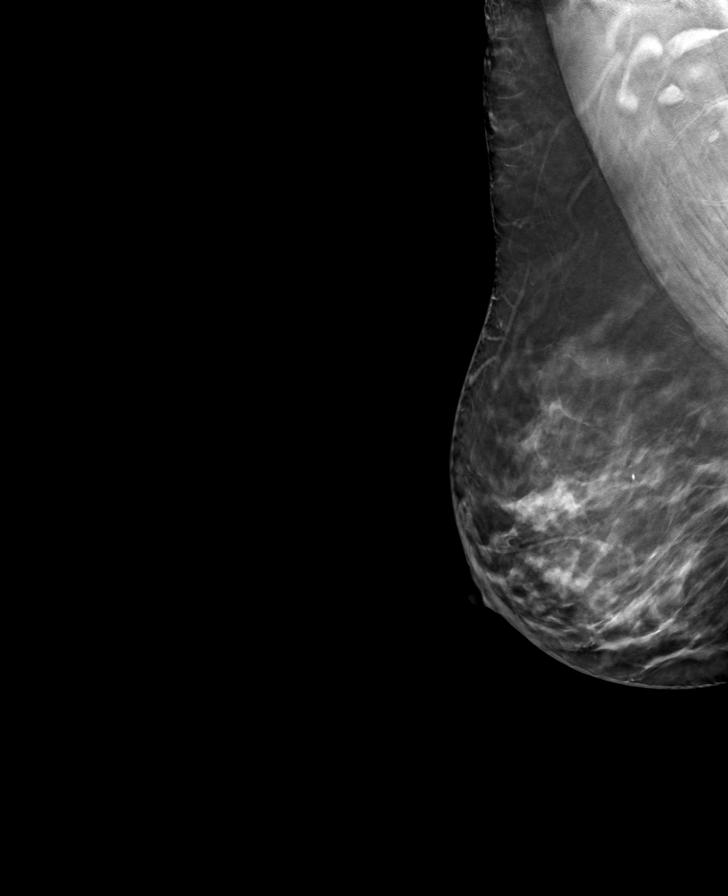

[R CC tomo · tomo slice 39/78.0]
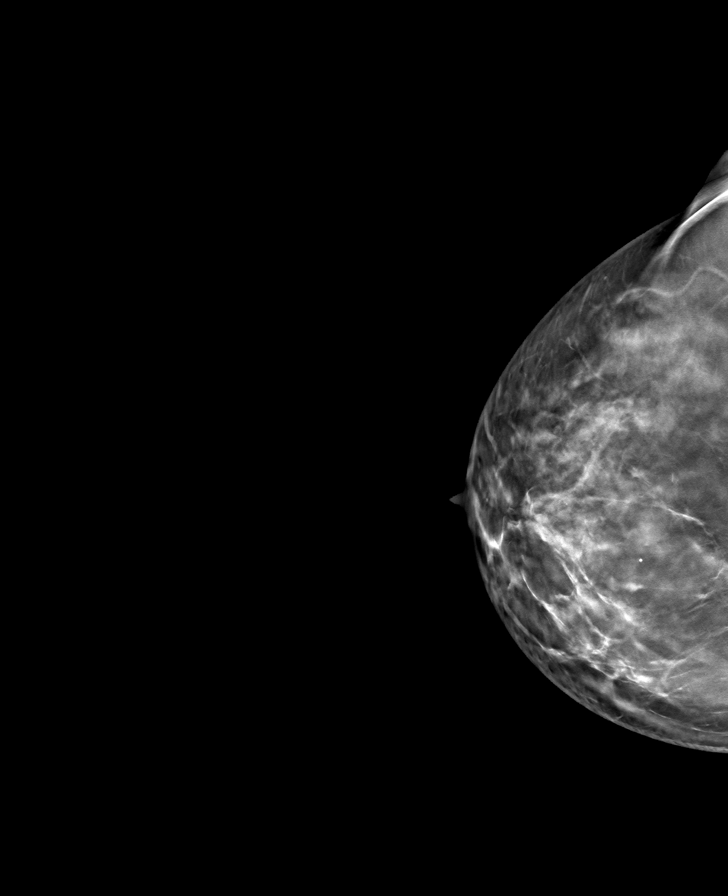

[L CC tomo · tomo slice 42/83.0]
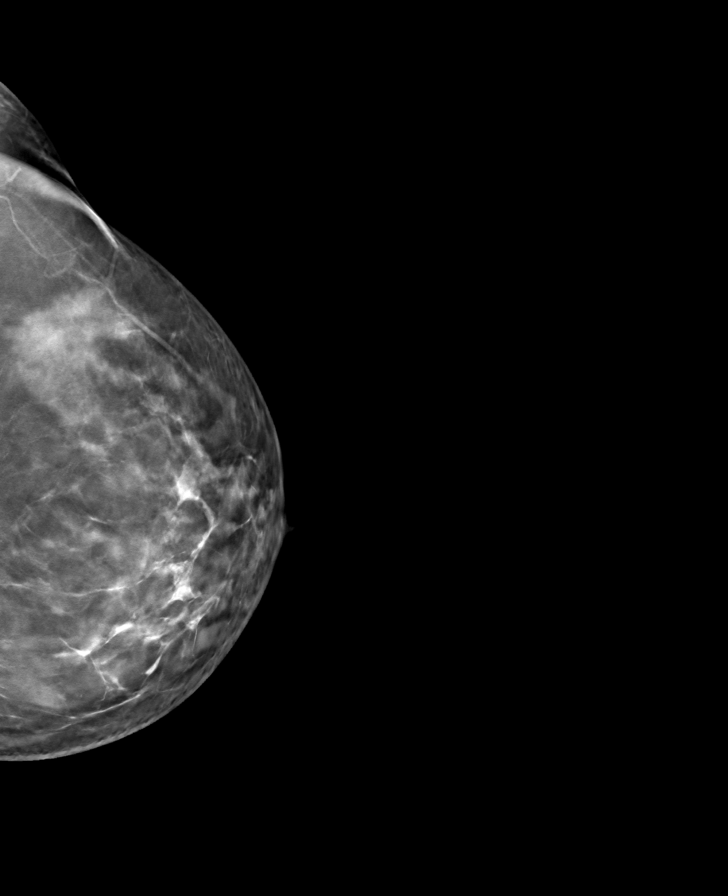

[8 of 24 positions shown; findings below may reference images not displayed]

ACR Breast Density Category c: The breast tissue is heterogeneously
dense, which may obscure small masses
FINDINGS: There are no findings suspicious for malignancy.
IMPRESSION: No mammographic evidence of malignancy. A result letter of this
screening mammogram will be mailed directly to the patient.

RECOMMENDATION:
Screening mammogram in one year. (Code:C8-T-HNK)

BI-RADS CATEGORY  1: Negative.

## 2022-12-19 ENCOUNTER — Other Ambulatory Visit: Payer: Self-pay | Admitting: Family Medicine

## 2022-12-19 ENCOUNTER — Ambulatory Visit
Admission: RE | Admit: 2022-12-19 | Discharge: 2022-12-19 | Disposition: A | Discharge status: Home / Self Care | Payer: BC Managed Care – PPO | Source: Ambulatory Visit | Attending: Family Medicine | Admitting: Family Medicine

## 2022-12-19 DIAGNOSIS — Z1231 Encounter for screening mammogram for malignant neoplasm of breast: Secondary | ICD-10-CM

## 2023-11-29 ENCOUNTER — Other Ambulatory Visit: Payer: Self-pay | Admitting: Family Medicine

## 2023-11-29 DIAGNOSIS — Z1231 Encounter for screening mammogram for malignant neoplasm of breast: Secondary | ICD-10-CM

## 2023-12-23 ENCOUNTER — Inpatient Hospital Stay: Admission: RE | Admit: 2023-12-23 | Payer: BC Managed Care – PPO | Source: Ambulatory Visit

## 2024-04-17 ENCOUNTER — Encounter: Payer: Self-pay | Admitting: Gastroenterology
